# Patient Record
Sex: Male | Born: 1995 | Race: White | Hispanic: No | Marital: Single | Smoking: Former smoker
Health system: Southern US, Community
[De-identification: ages and names within clinical notes are randomized; demographics above are authoritative.]

## PROBLEM LIST (undated history)

## (undated) DIAGNOSIS — F1410 Cocaine abuse, uncomplicated: Secondary | ICD-10-CM

## (undated) DIAGNOSIS — F1490 Cocaine use, unspecified, uncomplicated: Secondary | ICD-10-CM

## (undated) DIAGNOSIS — K759 Inflammatory liver disease, unspecified: Secondary | ICD-10-CM

## (undated) DIAGNOSIS — J45909 Unspecified asthma, uncomplicated: Secondary | ICD-10-CM

---
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
## 2016-11-29 ENCOUNTER — Emergency Department
Admission: EM | Admit: 2016-11-29 | Discharge: 2016-11-29 | Disposition: A | Discharge status: Home / Self Care | Payer: Managed Care, Other (non HMO) | Attending: Emergency Medicine | Admitting: Emergency Medicine

## 2016-11-29 DIAGNOSIS — S025XXB Fracture of tooth (traumatic), initial encounter for open fracture: Secondary | ICD-10-CM | POA: Insufficient documentation

## 2016-11-29 DIAGNOSIS — F17210 Nicotine dependence, cigarettes, uncomplicated: Secondary | ICD-10-CM | POA: Insufficient documentation

## 2016-11-29 DIAGNOSIS — Y999 Unspecified external cause status: Secondary | ICD-10-CM | POA: Diagnosis not present

## 2016-11-29 DIAGNOSIS — X58XXXA Exposure to other specified factors, initial encounter: Secondary | ICD-10-CM | POA: Diagnosis not present

## 2016-11-29 DIAGNOSIS — Y929 Unspecified place or not applicable: Secondary | ICD-10-CM | POA: Insufficient documentation

## 2016-11-29 DIAGNOSIS — Y939 Activity, unspecified: Secondary | ICD-10-CM | POA: Diagnosis not present

## 2016-11-29 DIAGNOSIS — S0993XA Unspecified injury of face, initial encounter: Secondary | ICD-10-CM | POA: Diagnosis present

## 2016-11-29 MED ORDER — AMOXICILLIN 500 MG PO CAPS: 500.0000 mg | ORAL_CAPSULE | Freq: Three times a day (TID) | ORAL | 0 refills | Status: DC

## 2016-11-29 NOTE — Discharge Instructions (Signed)
OPTIONS FOR DENTAL FOLLOW UP CARE ° °[place] [facility] and [facility] - Local Safety Net Dental Clinics °http://www.ncdhhs.gov/dph/oralhealth/services/safetynetclinics.htm °  °[facility] (336-562-3123) ° °[facility] [place] ([phone]) ° °[facility] ([phone] ext [phone]) ° °[facility] ([phone]) ° °[facility] ([phone]) °This clinic caters to the indigent population and is on a lottery system. °Location: °[facility], [address], [place] °Clinic Hours: °Wednesdays from 6pm - 9pm, patients seen by a lottery system. °For dates, call or go to [email or web address] °Services: °Cleanings, fillings and simple extractions. °Payment Options: °DENTAL WORK IS FREE OF CHARGE. Bring proof of income or support. °Best way to get seen: °Arrive at 5:15 pm - this is a lottery, NOT first come/first serve, so arriving earlier will not increase your chances of being seen. °  °  °[facility] °[phone] °Select option 1 for emergencies °  °Location: °[facility], [address], [place] °Clinic Hours: °No walk-ins accepted - call the day before to schedule an appointment. °Check in times are 9:30 am and 1:30 pm. °Services: °Simple extractions, temporary fillings, pulpectomy/pulp debridement, uncomplicated abscess drainage. °Payment Options: °PAYMENT IS DUE AT THE TIME OF SERVICE.  Fee is usually $100-200, additional surgical procedures (e.g. abscess drainage) may be extra. °Cash, checks, Visa/MasterCard accepted.  Can file Medicaid if patient is covered for dental - patient should call case worker to check. °No discount for [facility] patients. °Best way to get seen: °MUST call the day before and get onto the schedule. Can usually be seen the next 1-2 days. No walk-ins accepted. °  °  °[facility] °[phone] °   °Location: °[facility], [address], [place] °Clinic Hours: °M, W, Th, F 8am or 1:30pm, Tues 9a or 1:30 - first come/first served. °Services: °Simple extractions, temporary fillings, uncomplicated abscess drainage.  You do not need to be an [place] resident. °Payment Options: °PAYMENT IS DUE AT THE TIME OF SERVICE. °Dental insurance, otherwise sliding scale - bring proof of income or support. °Depending on income and treatment needed, cost is usually $50-200. °Best way to get seen: °Arrive early as it is first come/first served. °  °  °[facility] °[phone] °  °Location: °[address] °Clinic Hours: °Mon-Thu 8a-5p °Services: °Most basic dental services including extractions and fillings. °Payment Options: °PAYMENT IS DUE AT THE TIME OF SERVICE. °Sliding scale, up to 50% off - bring proof if income or support. °Medicaid with dental option accepted. °Best way to get seen: °Call to schedule an appointment, can usually be seen within 2 weeks OR they will try to see walk-ins - show up at 8a or 2p (you may have to wait). °  °  °[facility] °[phone] °[place] RESIDENTS ONLY °  °Location: °[facility], [address], [place],  [address] °Clinic Hours: By appointment only. °Monday - Thursday 8am-5pm, Friday 8am-12pm °Services: Cleanings, fillings, extractions. °Payment Options: °PAYMENT IS DUE AT THE TIME OF SERVICE. °Cash, Visa or MasterCard. Sliding scale - $30 minimum per service. °Best way to get seen: °Come in to office, complete packet and make an appointment - need proof of income °or support monies for each household member and proof of [place] residence. °Usually takes about a month to get in. °  °  °[facility] °[phone] °  °Location: °[address]   [place] °Clinic Hours: Walk-in Urgent Care Dental Services are offered Monday-Friday  mornings only. °The numbers of emergencies accepted daily is limited to the number of °providers available. °Maximum 15 - Mondays, Wednesdays & Thursdays °Maximum 10 - Tuesdays & Fridays °Services: °You do not need to be a [place] resident to be seen for a dental emergency. °Emergencies are defined as pain, swelling, abnormal bleeding, or dental trauma. Walkins will receive x-rays if needed. °NOTE: Dental cleaning is not an emergency. °Payment Options: °PAYMENT IS DUE AT THE TIME OF SERVICE. °Minimum co-pay is $40.00 for uninsured patients. °Minimum co-pay is $3.00 for Medicaid with dental coverage. °Dental Insurance is accepted and must be presented at time of visit. °Medicare does not cover dental. °Forms of payment: Cash, credit card, checks. °Best way to get seen: °If not previously registered with the clinic, walk-in dental registration begins at 7:15 am and is on a first come/first serve basis. °If previously registered with the clinic, call to make an appointment. °  °  °[facility] °[phone] °[place] RESIDENTS ONLY °  °Location: °[address], [place], [place] °Clinic Hours: °Mon-Thu 10a-2p °Services: Extractions only! °Payment Options: °FREE (donations accepted) - bring proof of income or support °Best way to get seen: °Call and schedule an appointment OR come at 8am on the 1st Monday of every month (except for holidays) when it is first come/first served. °  °  °[facility] °[phone] °  °Location: °[address], [place] °Clinic Hours: °Friday mornings °Services, Payment Options, Best way to get seen: °Call for info °

## 2016-11-29 NOTE — ED Triage Notes (Signed)
States toothache L lower x 1 month, escalated today. Thinks he broke it yesterday.

## 2016-11-29 NOTE — ED Provider Notes (Signed)
[facility] Emergency Department Provider Note  ____________________________________________  Time seen: Approximately 1:06 PM  I have reviewed the triage vital signs and the nursing notes.   HISTORY  Chief Complaint Dental Pain    HPI Brian KerbsJason Zhan Jr. is a 21 y.o. male that presents to the emergency department with a broken tooth for 1 day. Patient states that he was pretty sure that the tooth was infected before was broken. He states that it's been painful and swollen for a couple of days. Yesterday he was eating popcorn and a large piece of his tooth broke off. He has braces on his teeth but no longer sees the orthodontist. He has been meaning to get them removed. He is not having any difficulty opening or closing his mouth. No drainage from mouth. He denies fever, shortness breath, chest pain, nausea, vomiting, abdominal pain.   No past medical history on file.  There are no active problems to display for this patient.   No past surgical history on file.  Prior to Admission medications   Medication Sig Start Date End Date Taking? Authorizing Provider  amoxicillin (AMOXIL) 500 MG capsule Take 1 capsule (500 mg total) by mouth 3 (three) times daily. 11/29/16   Enid DerryWagner, Grier Vu, PA-C  HYDROcodone-acetaminophen (NORCO/VICODIN) 5-325 MG tablet Take 1 tablet by mouth every 4 (four) hours as needed for moderate pain. 12/20/15   Cuthriell, Delorise RoyalsJonathan D, PA-C  lidocaine (XYLOCAINE) 2 % solution Apply small amount to cotton ball and apply to tooth prn pain 12/19/15   Tommi RumpsSummers, Rhonda L, PA-C    Allergies Patient has no known allergies.  No family history on file.  Social History Social History  Substance Use Topics  . Smoking status: Current Every Day Smoker    Packs/day: 0.50    Types: Cigarettes  . Smokeless tobacco: Not on file  . Alcohol use No     Review of Systems  Constitutional: No fever/chills Cardiovascular: No chest pain. Respiratory: No  SOB. Gastrointestinal: No abdominal pain.  No nausea, no vomiting.  Skin: Negative for rash, abrasions, lacerations, ecchymosis. Neurological: Negative for headaches   ____________________________________________   PHYSICAL EXAM:  VITAL SIGNS: ED Triage Vitals [11/29/16 1214]  Enc Vitals Group     BP 140/75     Pulse Rate (!) 57     Resp 18     Temp 98 F (36.7 C)     Temp Source Oral     SpO2 100 %     Weight 145 lb (65.8 kg)     Height 6' (1.829 m)     Head Circumference      Peak Flow      Pain Score 10     Pain Loc      Pain Edu?      Excl. in GC?      Constitutional: Alert and oriented. Well appearing and in no acute distress. Eyes: Conjunctivae are normal. PERRL. EOMI. Head: Atraumatic. ENT:      Ears:      Nose: No congestion/rhinnorhea.      Mouth/Throat: Mucous membranes are moist. Oropharynx nonerythematous. Uvula midline. Poor dentition overall. 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 Poor dentition overall. Braces on teeth with breaks in the medal. No difficulty opening and closing mouth. No TMJ pain. No drainage from mouth. No swelling. Neck: No stridor.  Cardiovascular: Normal rate, regular rhythm.  Good peripheral circulation. Respiratory: Normal respiratory effort without tachypnea or retractions. Lungs CTAB. Good air entry to the bases  with no decreased or absent breath sounds. Musculoskeletal: Full range of motion to all extremities. No gross deformities appreciated. Neurologic:  Normal speech and language. No gross focal neurologic deficits are appreciated.  Skin:  Skin is warm, dry and intact. No rash noted.   ____________________________________________   LABS (all labs ordered are listed, but only abnormal results are displayed)  Labs Reviewed - No data to display ____________________________________________  EKG   ____________________________________________  RADIOLOGY   No results  found.  ____________________________________________    PROCEDURES  Procedure(s) performed:    Procedures    Medications - No data to display   ____________________________________________   INITIAL IMPRESSION / ASSESSMENT AND PLAN / ED COURSE  Pertinent labs & imaging results that were available during my care of the patient were reviewed by me and considered in my medical decision making (see chart for details).  Review of the [place] CSRS was performed in accordance of the [facility] prior to dispensing any controlled drugs.   Patient's diagnosis is consistent with tooth fracture and infection. Vital signs and exam are reassuring. Education was provided and the patient is instructed to follow up with dentist. Patient will be discharged home with prescriptions for amoxicillin. Patient is given ED precautions to return to the ED for any worsening or new symptoms.     ____________________________________________  FINAL CLINICAL IMPRESSION(S) / ED DIAGNOSES  Final diagnoses:  Open fracture of tooth, initial encounter      NEW MEDICATIONS STARTED DURING THIS VISIT:  Discharge Medication List as of 11/29/2016  1:07 PM          This chart was dictated using voice recognition software/Dragon. Despite best efforts to proofread, errors can occur which can change the meaning. Any change was purely unintentional.    Enid Derry, PA-C 11/29/16 1358    Sharyn Creamer, MD 11/29/16 1610

## 2017-07-01 ENCOUNTER — Emergency Department: Payer: Managed Care, Other (non HMO)

## 2017-07-01 ENCOUNTER — Emergency Department
Admission: EM | Admit: 2017-07-01 | Discharge: 2017-07-01 | Disposition: A | Discharge status: Home / Self Care | Payer: Managed Care, Other (non HMO) | Attending: Emergency Medicine | Admitting: Emergency Medicine

## 2017-07-01 ENCOUNTER — Encounter: Payer: Self-pay | Admitting: Intensive Care

## 2017-07-01 DIAGNOSIS — R1011 Right upper quadrant pain: Secondary | ICD-10-CM | POA: Diagnosis present

## 2017-07-01 DIAGNOSIS — K2900 Acute gastritis without bleeding: Secondary | ICD-10-CM | POA: Insufficient documentation

## 2017-07-01 DIAGNOSIS — F17210 Nicotine dependence, cigarettes, uncomplicated: Secondary | ICD-10-CM | POA: Diagnosis not present

## 2017-07-01 LAB — URINALYSIS, COMPLETE (UACMP) WITH MICROSCOPIC
BACTERIA UA: NONE SEEN
Bilirubin Urine: NEGATIVE
GLUCOSE, UA: NEGATIVE mg/dL
Hgb urine dipstick: NEGATIVE
KETONES UR: NEGATIVE mg/dL
Leukocytes, UA: NEGATIVE
NITRITE: NEGATIVE
PROTEIN: 100 mg/dL — AB
Specific Gravity, Urine: 1.031 — ABNORMAL HIGH (ref 1.005–1.030)
Squamous Epithelial / LPF: NONE SEEN
pH: 5 (ref 5.0–8.0)

## 2017-07-01 LAB — COMPREHENSIVE METABOLIC PANEL
ALT: 16 U/L — ABNORMAL LOW (ref 17–63)
AST: 22 U/L (ref 15–41)
Albumin: 4.6 g/dL (ref 3.5–5.0)
Alkaline Phosphatase: 92 U/L (ref 38–126)
Anion gap: 8 (ref 5–15)
BILIRUBIN TOTAL: 0.8 mg/dL (ref 0.3–1.2)
BUN: 10 mg/dL (ref 6–20)
CO2: 29 mmol/L (ref 22–32)
Calcium: 9.3 mg/dL (ref 8.9–10.3)
Chloride: 99 mmol/L — ABNORMAL LOW (ref 101–111)
Creatinine, Ser: 1.29 mg/dL — ABNORMAL HIGH (ref 0.61–1.24)
GFR calc Af Amer: >60 mL/min (ref >60)
GFR calc non Af Amer: >60 mL/min (ref >60)
Glucose, Bld: 141 mg/dL — ABNORMAL HIGH (ref 65–99)
POTASSIUM: 3.7 mmol/L (ref 3.5–5.1)
Sodium: 136 mmol/L (ref 135–145)
TOTAL PROTEIN: 7.3 g/dL (ref 6.5–8.1)

## 2017-07-01 LAB — LIPASE, BLOOD: Lipase: 20 U/L (ref 11–51)

## 2017-07-01 LAB — CBC
HEMATOCRIT: 45.4 % (ref 40.0–52.0)
Hemoglobin: 15.5 g/dL (ref 13.0–18.0)
MCH: 29.7 pg (ref 26.0–34.0)
MCHC: 34.1 g/dL (ref 32.0–36.0)
MCV: 87.3 fL (ref 80.0–100.0)
Platelets: 266 10*3/uL (ref 150–440)
RBC: 5.21 MIL/uL (ref 4.40–5.90)
RDW: 13 % (ref 11.5–14.5)
WBC: 18.3 10*3/uL — AB (ref 3.8–10.6)

## 2017-07-01 MED ORDER — SODIUM CHLORIDE 0.9 % IV BOLUS (SEPSIS)
1000.0000 mL | INTRAVENOUS | Status: AC
  Administered 2017-07-01: 1000 mL via INTRAVENOUS

## 2017-07-01 MED ORDER — OMEPRAZOLE 20 MG PO CPDR: 20.0000 mg | DELAYED_RELEASE_CAPSULE | Freq: Two times a day (BID) | ORAL | 1 refills | Status: DC

## 2017-07-01 MED ORDER — IOPAMIDOL (ISOVUE-300) INJECTION 61%
30.0000 mL | Freq: Once | INTRAVENOUS | Status: AC | PRN
  Administered 2017-07-01: 15 mL via ORAL
  Filled 2017-07-01: qty 30

## 2017-07-01 MED ORDER — IOPAMIDOL (ISOVUE-300) INJECTION 61%
100.0000 mL | Freq: Once | INTRAVENOUS | Status: AC | PRN
  Administered 2017-07-01: 100 mL via INTRAVENOUS
  Filled 2017-07-01: qty 100

## 2017-07-01 MED ORDER — ONDANSETRON 4 MG PO TBDP: ORAL_TABLET | ORAL | 0 refills | Status: DC

## 2017-07-01 NOTE — ED Triage Notes (Signed)
Patient c/o epigastric pain X 1 week. Reports after eating and drinking getting very bloated and needing to throw up to feel better. Tender to touch on upper abdomen. Ambulatory with no problems

## 2017-07-01 NOTE — ED Provider Notes (Signed)
[facility] Emergency Department Provider Note  ____________________________________________   First MD Initiated Contact with Patient 07/01/17 2023     (approximate)  I have reviewed the triage vital signs and the nursing notes.   HISTORY  Chief Complaint Abdominal Pain    HPI Brian KerbsJason Folmar Jr. is a 21 y.o. male with no chronic medical history who presents for about 1 week of persistent and episodic epigastric and right upper quadrant pain.  He says it always occurs after he eats and is quite severe.  He had numerous episodes of vomiting last night in particular but has had intermittent vomiting throughout the week as well as multiple episodes of diarrhea.  Initially he thought may be it was food poisoning but it is lasted for a week rather than being only a couple of days.  He describes the pain as sharp, aching, and at times burning.  He denies fever/chills, chest pain, shortness of breath, and dysuria.  He describes the symptoms as severe and nothing makes it better including Pepto-Bismol and ibuprofen.  History reviewed. No pertinent past medical history.  There are no active problems to display for this patient.   History reviewed. No pertinent surgical history.  Prior to Admission medications   Medication Sig Start Date End Date Taking? Authorizing Provider  amoxicillin (AMOXIL) 500 MG capsule Take 1 capsule (500 mg total) by mouth 3 (three) times daily. 11/29/16   Enid DerryWagner, Ashley, PA-C  HYDROcodone-acetaminophen (NORCO/VICODIN) 5-325 MG tablet Take 1 tablet by mouth every 4 (four) hours as needed for moderate pain. 12/20/15   Cuthriell, Delorise RoyalsJonathan D, PA-C  lidocaine (XYLOCAINE) 2 % solution Apply small amount to cotton ball and apply to tooth prn pain 12/19/15   Tommi RumpsSummers, Rhonda L, PA-C  omeprazole (PRILOSEC) 20 MG capsule Take 1 capsule (20 mg total) by mouth 2 (two) times daily. 07/01/17 07/01/18  Loleta RoseForbach, Reesha Debes, MD  ondansetron (ZOFRAN ODT) 4 MG  disintegrating tablet Allow 1-2 tablets to dissolve in your mouth every 8 hours as needed for nausea/vomiting 07/01/17   Loleta RoseForbach, [place] Droll, MD    Allergies Patient has no known allergies.  History reviewed. No pertinent family history.  Social History Social History   Tobacco Use  . Smoking status: Current Every Day Smoker    Packs/day: 0.50    Types: Cigarettes  . Smokeless tobacco: Never Used  Substance Use Topics  . Alcohol use: Yes    Comment: occ  . Drug use: Yes    Types: Marijuana    Review of Systems Constitutional: No fever/chills Eyes: No visual changes. ENT: No sore throat. Cardiovascular: Denies chest pain. Respiratory: Denies shortness of breath. Gastrointestinal: As above Genitourinary: Negative for dysuria. Musculoskeletal: Negative for neck pain.  Negative for back pain. Integumentary: Negative for rash. Neurological: Negative for headaches, focal weakness or numbness.   ____________________________________________   PHYSICAL EXAM:  VITAL SIGNS: ED Triage Vitals  Enc Vitals Group     BP 07/01/17 1858 (!) 148/73     Pulse Rate 07/01/17 1858 89     Resp 07/01/17 1858 16     Temp 07/01/17 1858 97.6 F (36.4 C)     Temp Source 07/01/17 1858 Oral     SpO2 07/01/17 1858 99 %     Weight 07/01/17 1856 65.8 kg (145 lb)     Height 07/01/17 1856 1.829 m (6')     Head Circumference --      Peak Flow --      Pain Score 07/01/17 1856  10     Pain Loc --      Pain Edu? --      Excl. in GC? --     Constitutional: Alert and oriented.  Disheveled but generally well-appearing Eyes: Conjunctivae are normal.  Head: Atraumatic. Cardiovascular: Normal rate, regular rhythm. Good peripheral circulation. Grossly normal heart sounds. Respiratory: Normal respiratory effort.  No retractions. Lungs CTAB. Gastrointestinal: Soft with no lower abdominal tenderness but significant epigastric tenderness and right upper quadrant tenderness with positive Murphy  sign. Musculoskeletal: No lower extremity tenderness nor edema. No gross deformities of extremities. Neurologic:  Normal speech and language. No gross focal neurologic deficits are appreciated.  Skin:  Skin is warm, dry and intact. No rash noted. Psychiatric: Mood and affect are normal. Speech and behavior are normal.  ____________________________________________   LABS (all labs ordered are listed, but only abnormal results are displayed)  Labs Reviewed  COMPREHENSIVE METABOLIC PANEL - Abnormal; Notable for the following components:      Result Value   Chloride 99 (*)    Glucose, Bld 141 (*)    Creatinine, Ser 1.29 (*)    ALT 16 (*)    All other components within normal limits  CBC - Abnormal; Notable for the following components:   WBC 18.3 (*)    All other components within normal limits  URINALYSIS, COMPLETE (UACMP) WITH MICROSCOPIC - Abnormal; Notable for the following components:   Color, Urine AMBER (*)    APPearance CLEAR (*)    Specific Gravity, Urine 1.031 (*)    Protein, ur 100 (*)    All other components within normal limits  LIPASE, BLOOD   ____________________________________________  EKG  None - EKG not ordered by ED physician ____________________________________________  RADIOLOGY   Ct Abdomen Pelvis W Contrast  Result Date: 07/01/2017 CLINICAL DATA:  Upper abdominal pain for 6 days. Diarrhea and vomiting. EXAM: CT ABDOMEN AND PELVIS WITH CONTRAST TECHNIQUE: Multidetector CT imaging of the abdomen and pelvis was performed using the standard protocol following bolus administration of intravenous contrast. CONTRAST:  100mL ISOVUE-300 IOPAMIDOL (ISOVUE-300) INJECTION 61% COMPARISON:  None. FINDINGS: Lower chest: No acute abnormality. Hepatobiliary: No focal liver abnormality is seen. No gallstones, gallbladder wall thickening, or biliary dilatation. Pancreas: Unremarkable. No pancreatic ductal dilatation or surrounding inflammatory changes. Spleen: Normal in  size without focal abnormality. Adrenals/Urinary Tract: Adrenal glands are unremarkable. Kidneys are normal, without renal calculi, focal lesion, or hydronephrosis. Bladder is unremarkable. Stomach/Bowel: Stomach is within normal limits. Appendix is normal. No evidence of bowel wall thickening, distention, or inflammatory changes. Enteric contrast has reached the right hemicolon. Vascular/Lymphatic: No significant vascular findings are present. No enlarged abdominal or pelvic lymph nodes. Reproductive: Unremarkable Other: No focal inflammation.  No ascites. Musculoskeletal: No significant skeletal lesion. IMPRESSION: No significant abnormality. Electronically Signed   By: Ellery Plunkaniel R Mitchell M.D.   On: 07/01/2017 22:48   [place] Abdomen Limited Ruq  Result Date: 07/01/2017 CLINICAL DATA:  Severe epigastric and right upper quadrant pain for 1 week. EXAM: ULTRASOUND ABDOMEN LIMITED RIGHT UPPER QUADRANT COMPARISON:  None. FINDINGS: Gallbladder: The gallbladder is normally dilated. There is gallbladder sludge. Wall thickness measures 2.8 mm, upper limits of normal. Sonographic Murphy's sign was reported as negative. Common bile duct: Diameter: 2.2 mm Liver: No focal lesion identified. Within normal limits in parenchymal echogenicity. Portal vein is patent on color Doppler imaging with normal direction of blood flow towards the liver. IMPRESSION: Gallbladder sludge without evidence of acute cholecystitis. Electronically Signed   By: Ted Mcalpineobrinka  Dimitrova  M.D.   On: 07/01/2017 21:29    ____________________________________________   PROCEDURES  Critical Care performed: No   Procedure(s) performed:   Procedures   ____________________________________________   INITIAL IMPRESSION / ASSESSMENT AND PLAN / ED COURSE  As part of my medical decision making, I reviewed the following data within the electronic MEDICAL RECORD NUMBER Nursing notes reviewed and incorporated and Labs reviewed     Differential diagnosis  includes, but is not limited to, biliary disease (biliary colic, acute cholecystitis, cholangitis, choledocholithiasis, etc), intrathoracic causes for epigastric abdominal pain including ACS, gastritis, duodenitis, pancreatitis, small bowel or large bowel obstruction, abdominal aortic aneurysm, hernia, and gastritis.  Biliary colic would be unlikely at his age but the history of present illness and physical exam are most consistent with this process.  I feel that gastritis or gastroenteritis is a likely secondary diagnosis.  The patient denies any recent alcohol use and drug use and denies any blood in his stool or vomit ulcerative disease is also possible.  If the ultrasound is normal I will consider whether he needs CT scan, but given the severity of his tenderness and his leukocytosis it may be worth a scan to make sure there is not any obvious gastric abnormality such as an ulcer.  Clinical Course as of Jul 02 2255  Wed Jul 01, 2017  2136 Gallbladder sludge with thickened wall at the upper limit of normal but no evidence of gallstones and no solid evidence of cholecystitis. US ABDOMEN LIMITED RUQ [CF]  2254 No evidence of acute abnormality on CT scan.  Patient is comfortable with the plan to go home and follow-up as an outpatient.  I am treating him empirically for peptic ulcer disease with my recommendation of 2 months of twice daily PPI, bland diet, avoidance of NSAIDs, etc.  I did, however, encouraged him to follow-up with GI given his young age, severity of symptoms, and need for possible additional testing.  He understands and states he will do so. CT Abdomen Pelvis W Contrast [CF]  2255 Gave my usual and customary return precautions.  [CF]    Clinical Course User Index [CF] Loleta Rose, MD    ____________________________________________  FINAL CLINICAL IMPRESSION(S) / ED DIAGNOSES  Final diagnoses:  Acute gastritis without hemorrhage, unspecified gastritis type     MEDICATIONS GIVEN  DURING THIS VISIT:  Medications  sodium chloride 0.9 % bolus 1,000 mL (1,000 mLs Intravenous New Bag/Given 07/01/17 2204)  iopamidol (ISOVUE-300) 61 % injection 30 mL (15 mLs Oral Contrast Given 07/01/17 2206)  iopamidol (ISOVUE-300) 61 % injection 100 mL (100 mLs Intravenous Contrast Given 07/01/17 2226)     ED Discharge Orders        Ordered    omeprazole (PRILOSEC) 20 MG capsule  2 times daily     07/01/17 2247    ondansetron (ZOFRAN ODT) 4 MG disintegrating tablet     07/01/17 2247       Note:  This document was prepared using Dragon voice recognition software and may include unintentional dictation errors.    Loleta Rose, MD 07/01/17 2256

## 2017-07-01 NOTE — Discharge Instructions (Signed)
As we discussed, we believe your symptoms are the result of gastritis, or possibly a small ulcer that is not visible on CT scan.  We encourage you to take omeprazole as prescribed for at least a full two months (longer if you continue having symptoms).  Please avoid taking ibuprofen, naproxen, aspirin, Goody or BC powders, or similar medications (called NSAIDs).  You may take over-the-counter Tylenol as needed for pain.  Please read through the included information and try to avoid spicy and greasy foods in addition to the medications listed above.  We also strongly encourage you to call the office of Dr. Tobi BastosAnna to schedule a follow up appointment with gastroenterology.  They may feel you would benefit from additional testing or treatment and can also schedule you for an endoscopy if they feel it would be beneficial.  Return to the emergency department if you develop new or worsening symptoms that concern you.

## 2017-07-01 NOTE — ED Notes (Signed)
Pt reports upper abd pain for 6 days.  Pt has watery diarrhea and vomiting.   Pt taking otc meds without relief.  Pt states pain is worse after eating meals.  Pt alert.

## 2017-07-09 ENCOUNTER — Ambulatory Visit: Payer: Managed Care, Other (non HMO) | Admitting: Gastroenterology

## 2017-07-30 ENCOUNTER — Ambulatory Visit: Payer: Managed Care, Other (non HMO) | Admitting: Gastroenterology

## 2017-07-30 ENCOUNTER — Encounter: Payer: Self-pay | Admitting: Gastroenterology

## 2017-11-02 ENCOUNTER — Encounter: Payer: Self-pay | Admitting: Emergency Medicine

## 2017-11-02 ENCOUNTER — Other Ambulatory Visit: Payer: Self-pay

## 2017-11-02 ENCOUNTER — Emergency Department
Admission: EM | Admit: 2017-11-02 | Discharge: 2017-11-02 | Disposition: A | Discharge status: Home / Self Care | Payer: Managed Care, Other (non HMO) | Attending: Emergency Medicine | Admitting: Emergency Medicine

## 2017-11-02 DIAGNOSIS — F1110 Opioid abuse, uncomplicated: Secondary | ICD-10-CM | POA: Insufficient documentation

## 2017-11-02 DIAGNOSIS — Z5321 Procedure and treatment not carried out due to patient leaving prior to being seen by health care provider: Secondary | ICD-10-CM | POA: Insufficient documentation

## 2017-11-02 NOTE — ED Triage Notes (Signed)
Patient states "I am just trying to get clean, detox, from heroin."  Last used at 0900.  Denies SI/ HI.  AAOx3.  Calm and cooperative.

## 2017-11-06 ENCOUNTER — Encounter: Payer: Self-pay | Admitting: Emergency Medicine

## 2017-11-06 ENCOUNTER — Emergency Department
Admission: EM | Admit: 2017-11-06 | Discharge: 2017-11-06 | Disposition: A | Discharge status: Home / Self Care | Payer: Self-pay | Attending: Emergency Medicine | Admitting: Emergency Medicine

## 2017-11-06 ENCOUNTER — Other Ambulatory Visit: Payer: Self-pay

## 2017-11-06 DIAGNOSIS — F1193 Opioid use, unspecified with withdrawal: Secondary | ICD-10-CM

## 2017-11-06 DIAGNOSIS — Z79899 Other long term (current) drug therapy: Secondary | ICD-10-CM | POA: Insufficient documentation

## 2017-11-06 DIAGNOSIS — F17210 Nicotine dependence, cigarettes, uncomplicated: Secondary | ICD-10-CM | POA: Insufficient documentation

## 2017-11-06 DIAGNOSIS — F1123 Opioid dependence with withdrawal: Secondary | ICD-10-CM | POA: Insufficient documentation

## 2017-11-06 DIAGNOSIS — F1110 Opioid abuse, uncomplicated: Secondary | ICD-10-CM

## 2017-11-06 MED ORDER — CLONIDINE HCL 0.1 MG PO TABS: 0.1000 mg | ORAL_TABLET | Freq: Three times a day (TID) | ORAL | 0 refills | Status: DC | PRN

## 2017-11-06 MED ORDER — IBUPROFEN 600 MG PO TABS
ORAL_TABLET | ORAL | Status: AC
  Administered 2017-11-06: 600 mg via ORAL
  Filled 2017-11-06: qty 1

## 2017-11-06 MED ORDER — ONDANSETRON 4 MG PO TBDP
4.0000 mg | ORAL_TABLET | Freq: Once | ORAL | Status: AC
  Administered 2017-11-06: 4 mg via ORAL

## 2017-11-06 MED ORDER — CYCLOBENZAPRINE HCL 10 MG PO TABS
ORAL_TABLET | ORAL | Status: AC
  Administered 2017-11-06: 10 mg via ORAL
  Filled 2017-11-06: qty 1

## 2017-11-06 MED ORDER — CLONIDINE HCL 0.1 MG PO TABS
0.1000 mg | ORAL_TABLET | Freq: Once | ORAL | Status: AC
  Administered 2017-11-06: 0.1 mg via ORAL

## 2017-11-06 MED ORDER — IBUPROFEN 400 MG PO TABS
600.0000 mg | ORAL_TABLET | Freq: Once | ORAL | Status: AC
  Administered 2017-11-06: 600 mg via ORAL

## 2017-11-06 MED ORDER — CYCLOBENZAPRINE HCL 10 MG PO TABS: 10.0000 mg | ORAL_TABLET | Freq: Three times a day (TID) | ORAL | 0 refills | Status: DC | PRN

## 2017-11-06 MED ORDER — CYCLOBENZAPRINE HCL 10 MG PO TABS
10.0000 mg | ORAL_TABLET | Freq: Once | ORAL | Status: AC
  Administered 2017-11-06: 10 mg via ORAL

## 2017-11-06 MED ORDER — CLONIDINE HCL 0.1 MG PO TABS
ORAL_TABLET | ORAL | Status: AC
  Administered 2017-11-06: 0.1 mg via ORAL
  Filled 2017-11-06: qty 1

## 2017-11-06 MED ORDER — ONDANSETRON 4 MG PO TBDP
ORAL_TABLET | ORAL | Status: AC
  Administered 2017-11-06: 4 mg via ORAL
  Filled 2017-11-06: qty 1

## 2017-11-06 MED ORDER — ONDANSETRON HCL 4 MG PO TABS: 4.0000 mg | ORAL_TABLET | Freq: Every day | ORAL | 0 refills | Status: AC | PRN

## 2017-11-06 MED ORDER — IBUPROFEN 600 MG PO TABS: 600.0000 mg | ORAL_TABLET | Freq: Three times a day (TID) | ORAL | 0 refills | Status: DC | PRN

## 2017-11-06 NOTE — ED Provider Notes (Signed)
[facility] Emergency Department Provider Note  ____________________________________________   First MD Initiated Contact with Patient 11/06/17 1354     (approximate)  I have reviewed the triage vital signs and the nursing notes.   HISTORY  Chief Complaint detox   HPI Brian Decker. is a 22 y.o. male who comes to the emergency department today with his parents requesting help with heroin withdrawal.  The patient is an IV heroin user and last used yesterday evening.  He uses intravenously daily for the past 6 months or so.  He has a bed waiting for him at [facility] for inpatient rehabilitation today however he has noted increasing nausea, food intolerance, myalgias, chills, and fatigue and is requesting help with his withdrawal symptoms.  He has Narcan at home.  His symptoms have been gradual onset are now moderate to severe.  Nothing seems to make them better they are clearly worse when not using heroin.  History reviewed. No pertinent past medical history.  There are no active problems to display for this patient.   History reviewed. No pertinent surgical history.  Prior to Admission medications   Medication Sig Start Date End Date Taking? Authorizing Provider  amoxicillin (AMOXIL) 500 MG capsule Take 1 capsule (500 mg total) by mouth 3 (three) times daily. 11/29/16   Enid Derry, PA-C  cloNIDine (CATAPRES) 0.1 MG tablet Take 1 tablet (0.1 mg total) by mouth 3 (three) times daily as needed (withdrawal symptoms). 11/06/17 11/06/18  Merrily Brittle, MD  cyclobenzaprine (FLEXERIL) 10 MG tablet Take 1 tablet (10 mg total) by mouth 3 (three) times daily as needed for muscle spasms. 11/06/17   Merrily Brittle, MD  HYDROcodone-acetaminophen (NORCO/VICODIN) 5-325 MG tablet Take 1 tablet by mouth every 4 (four) hours as needed for moderate pain. 12/20/15   Cuthriell, Delorise Royals, PA-C  ibuprofen (ADVIL,MOTRIN) 600 MG tablet Take 1 tablet (600 mg total) by mouth  every 8 (eight) hours as needed. 11/06/17   Merrily Brittle, MD  lidocaine (XYLOCAINE) 2 % solution Apply small amount to cotton ball and apply to tooth prn pain 12/19/15   Tommi Rumps, PA-C  omeprazole (PRILOSEC) 20 MG capsule Take 1 capsule (20 mg total) by mouth 2 (two) times daily. 07/01/17 07/01/18  Loleta Rose, MD  ondansetron (ZOFRAN ODT) 4 MG disintegrating tablet Allow 1-2 tablets to dissolve in your mouth every 8 hours as needed for nausea/vomiting 07/01/17   Loleta Rose, MD  ondansetron (ZOFRAN) 4 MG tablet Take 1 tablet (4 mg total) by mouth daily as needed. 11/06/17 11/06/18  Merrily Brittle, MD    Allergies Patient has no known allergies.  No family history on file.  Social History Social History   Tobacco Use  . Smoking status: Current Every Day Smoker    Packs/day: 0.50    Types: Cigarettes  . Smokeless tobacco: Never Used  Substance Use Topics  . Alcohol use: Yes    Comment: occ  . Drug use: Yes    Types: Marijuana, IV    Comment: heroin -- last used 11/02/17 @ 0900    Review of Systems Constitutional: Positive for chills Eyes: No visual changes. ENT: No sore throat. Cardiovascular: Denies chest pain. Respiratory: Denies shortness of breath. Gastrointestinal: Positive for abdominal pain.  Positive for nausea, no vomiting.  No diarrhea.  No constipation. Genitourinary: Negative for dysuria. Musculoskeletal: Negative for back pain. Skin: Negative for rash. Neurological: Positive for headache   ____________________________________________   PHYSICAL EXAM:  VITAL SIGNS: ED Triage  Vitals  Enc Vitals Group     BP 11/06/17 1254 123/72     Pulse Rate 11/06/17 1254 69     Resp 11/06/17 1254 18     Temp 11/06/17 1254 98.5 F (36.9 C)     Temp Source 11/06/17 1254 Oral     SpO2 11/06/17 1254 99 %     Weight 11/06/17 1255 145 lb (65.8 kg)     Height 11/06/17 1255 6' (1.829 m)     Head Circumference --      Peak Flow --      Pain Score 11/06/17  1258 0     Pain Loc --      Pain Edu? --      Excl. in GC? --     Constitutional: Alert and oriented x4 appears uncomfortable bundled up in blankets shivering Eyes: PERRL EOMI. pupils are 6 mm and brisk Head: Atraumatic. Nose: No congestion/rhinnorhea. Mouth/Throat: No trismus Neck: No stridor.   Cardiovascular: Normal rate, regular rhythm. Grossly normal heart sounds.  Good peripheral circulation. Respiratory: Normal respiratory effort.  No retractions. Lungs CTAB and moving good air Gastrointestinal: Soft abdomen mild diffuse tenderness with no focality no rebound or guarding or peritonitis Musculoskeletal: No lower extremity edema   Neurologic:  Normal speech and language. No gross focal neurologic deficits are appreciated. Skin: Positive for piloerection Psychiatric: Mood and affect are normal. Speech and behavior are normal.    ____________________________________________   DIFFERENTIAL includes but not limited to  Heroin addiction, heroin withdrawal, drug overdose ____________________________________________   LABS (all labs ordered are listed, but only abnormal results are displayed)  Labs Reviewed - No data to display   __________________________________________  EKG   ____________________________________________  RADIOLOGY   ____________________________________________   PROCEDURES  Procedure(s) performed: no  Procedures  Critical Care performed: no  Observation: no ____________________________________________   INITIAL IMPRESSION / ASSESSMENT AND PLAN / ED COURSE  Pertinent labs & imaging results that were available during my care of the patient were reviewed by me and considered in my medical decision making (see chart for details).  The patient is clearly withdrawing from opioids.  Feels improved after ibuprofen Flexeril and clonidine.  I also did give him naloxone for home however he said he already has some.  He is with his parents now  and has a place for inpatient rehabilitation tonight.  I discussed the importance of maintaining his sobriety and the danger of overdose if he should relapse in the future once he has fully detoxed.  I will also prescribe him a short course of clonidine to help him get through the acute symptoms.  He is discharged home in good condition and verbalizes understanding agreement plan.      ____________________________________________   FINAL CLINICAL IMPRESSION(S) / ED DIAGNOSES  Final diagnoses:  Heroin withdrawal (HCC)  Heroin abuse (HCC)      NEW MEDICATIONS STARTED DURING THIS VISIT:  New Prescriptions   CLONIDINE (CATAPRES) 0.1 MG TABLET    Take 1 tablet (0.1 mg total) by mouth 3 (three) times daily as needed (withdrawal symptoms).   CYCLOBENZAPRINE (FLEXERIL) 10 MG TABLET    Take 1 tablet (10 mg total) by mouth 3 (three) times daily as needed for muscle spasms.   IBUPROFEN (ADVIL,MOTRIN) 600 MG TABLET    Take 1 tablet (600 mg total) by mouth every 8 (eight) hours as needed.   ONDANSETRON (ZOFRAN) 4 MG TABLET    Take 1 tablet (4 mg total) by mouth daily as  needed.     Note:  This document was prepared using Dragon voice recognition software and may include unintentional dictation errors.     Merrily Brittleifenbark, Tajah Noguchi, MD 11/06/17 1452

## 2017-11-06 NOTE — Discharge Instructions (Signed)
Congratulations on your continuing sobriety.  In addition to Narcotics Anonymous please consider joining [facility] [email or web address] which has a different philosophy and can be quite effective.  Please return to the ED for any concerns.  It was a pleasure to take care of you today, and thank you for coming to our emergency department.  If you have any questions or concerns before leaving please ask the nurse to grab me and I'm more than happy to go through your aftercare instructions again.  If you were prescribed any opioid pain medication today such as Norco, Vicodin, Percocet, morphine, hydrocodone, or oxycodone please make sure you do not drive when you are taking this medication as it can alter your ability to drive safely.  If you have any concerns once you are home that you are not improving or are in fact getting worse before you can make it to your follow-up appointment, please do not hesitate to call 911 and come back for further evaluation.  Merrily BrittleNeil Eustace Hur, MD

## 2017-11-06 NOTE — ED Triage Notes (Addendum)
Arrives with c/o withdrawal from heroin.  States last used yesterday.  Arrives with mother, who states they have been trying to get into a treatment center and has been in touch with [facility], who states there will be some discharges tomorrow, so the plan is to go to [facility] tomorrow.  Patient hear for something to help him until that time.  Patient Awake and alert, calm and cooperative.  C/O generalized body aches and anxiety.  Denies SI/ HI.

## 2017-11-06 NOTE — ED Notes (Signed)
ED Provider at bedside. 

## 2019-07-27 ENCOUNTER — Encounter: Payer: Self-pay | Admitting: Emergency Medicine

## 2019-07-27 ENCOUNTER — Other Ambulatory Visit: Payer: Self-pay

## 2019-07-27 ENCOUNTER — Emergency Department
Admission: EM | Admit: 2019-07-27 | Discharge: 2019-07-27 | Disposition: A | Discharge status: Home / Self Care | Payer: Self-pay | Attending: Emergency Medicine | Admitting: Emergency Medicine

## 2019-07-27 DIAGNOSIS — F1110 Opioid abuse, uncomplicated: Secondary | ICD-10-CM | POA: Insufficient documentation

## 2019-07-27 DIAGNOSIS — Z5321 Procedure and treatment not carried out due to patient leaving prior to being seen by health care provider: Secondary | ICD-10-CM | POA: Insufficient documentation

## 2019-07-27 NOTE — ED Notes (Signed)
Called for patient multiple times to be roomed- unable to find patient.

## 2019-07-27 NOTE — ED Triage Notes (Signed)
Patient ambulatory to triage with steady gait, without difficulty or distress noted; pt reports using heroin tonight and wants help with detox

## 2019-08-06 ENCOUNTER — Emergency Department
Admission: EM | Admit: 2019-08-06 | Discharge: 2019-08-06 | Disposition: A | Discharge status: Home / Self Care | Payer: Self-pay | Attending: Emergency Medicine | Admitting: Emergency Medicine

## 2019-08-06 ENCOUNTER — Other Ambulatory Visit: Payer: Self-pay

## 2019-08-06 DIAGNOSIS — F1490 Cocaine use, unspecified, uncomplicated: Secondary | ICD-10-CM | POA: Insufficient documentation

## 2019-08-06 DIAGNOSIS — F1290 Cannabis use, unspecified, uncomplicated: Secondary | ICD-10-CM | POA: Insufficient documentation

## 2019-08-06 DIAGNOSIS — F14951 Cocaine use, unspecified with cocaine-induced psychotic disorder with hallucinations: Secondary | ICD-10-CM | POA: Insufficient documentation

## 2019-08-06 DIAGNOSIS — Z8659 Personal history of other mental and behavioral disorders: Secondary | ICD-10-CM | POA: Insufficient documentation

## 2019-08-06 DIAGNOSIS — F1190 Opioid use, unspecified, uncomplicated: Secondary | ICD-10-CM | POA: Insufficient documentation

## 2019-08-06 DIAGNOSIS — F1910 Other psychoactive substance abuse, uncomplicated: Secondary | ICD-10-CM | POA: Insufficient documentation

## 2019-08-06 DIAGNOSIS — F17210 Nicotine dependence, cigarettes, uncomplicated: Secondary | ICD-10-CM | POA: Insufficient documentation

## 2019-08-06 DIAGNOSIS — F19950 Other psychoactive substance use, unspecified with psychoactive substance-induced psychotic disorder with delusions: Secondary | ICD-10-CM | POA: Diagnosis present

## 2019-08-06 LAB — CBC
HCT: 43 % (ref 39.0–52.0)
Hemoglobin: 14.3 g/dL (ref 13.0–17.0)
MCH: 29.1 pg (ref 26.0–34.0)
MCHC: 33.3 g/dL (ref 30.0–36.0)
MCV: 87.6 fL (ref 80.0–100.0)
Platelets: 426 10*3/uL — ABNORMAL HIGH (ref 150–400)
RBC: 4.91 MIL/uL (ref 4.22–5.81)
RDW: 12.8 % (ref 11.5–15.5)
WBC: 17.4 10*3/uL — ABNORMAL HIGH (ref 4.0–10.5)
nRBC: 0 % (ref 0.0–0.2)

## 2019-08-06 LAB — COMPREHENSIVE METABOLIC PANEL
ALT: 247 U/L — ABNORMAL HIGH (ref 0–44)
AST: 125 U/L — ABNORMAL HIGH (ref 15–41)
Albumin: 4.9 g/dL (ref 3.5–5.0)
Alkaline Phosphatase: 113 U/L (ref 38–126)
Anion gap: 12 (ref 5–15)
BUN: 14 mg/dL (ref 6–20)
CO2: 25 mmol/L (ref 22–32)
Calcium: 9.6 mg/dL (ref 8.9–10.3)
Chloride: 101 mmol/L (ref 98–111)
Creatinine, Ser: 1.36 mg/dL — ABNORMAL HIGH (ref 0.61–1.24)
GFR calc Af Amer: >60 mL/min (ref >60)
GFR calc non Af Amer: >60 mL/min (ref >60)
Glucose, Bld: 110 mg/dL — ABNORMAL HIGH (ref 70–99)
Potassium: 3.9 mmol/L (ref 3.5–5.1)
Sodium: 138 mmol/L (ref 135–145)
Total Bilirubin: 1 mg/dL (ref 0.3–1.2)
Total Protein: 8.6 g/dL — ABNORMAL HIGH (ref 6.5–8.1)

## 2019-08-06 LAB — URINE DRUG SCREEN, QUALITATIVE (ARMC ONLY)
Amphetamines, Ur Screen: NOT DETECTED
Barbiturates, Ur Screen: NOT DETECTED
Benzodiazepine, Ur Scrn: NOT DETECTED
Cannabinoid 50 Ng, Ur ~~LOC~~: POSITIVE — AB
Cocaine Metabolite,Ur ~~LOC~~: POSITIVE — AB
MDMA (Ecstasy)Ur Screen: NOT DETECTED
Methadone Scn, Ur: NOT DETECTED
Opiate, Ur Screen: POSITIVE — AB
Phencyclidine (PCP) Ur S: NOT DETECTED
Tricyclic, Ur Screen: NOT DETECTED

## 2019-08-06 LAB — ACETAMINOPHEN LEVEL: Acetaminophen (Tylenol), Serum: <10 ug/mL — ABNORMAL LOW (ref 10–30)

## 2019-08-06 LAB — SALICYLATE LEVEL: Salicylate Lvl: <7 mg/dL — ABNORMAL LOW (ref 7.0–30.0)

## 2019-08-06 LAB — ETHANOL: Alcohol, Ethyl (B): <10 mg/dL (ref <10)

## 2019-08-06 NOTE — ED Notes (Signed)
Patient received breakfast tray and beverage, no signs of distress.

## 2019-08-06 NOTE — BH Assessment (Signed)
Writer spoke with patient to complete updated/reassessment. Patient denies SI/HI and AV/H. 

## 2019-08-06 NOTE — ED Notes (Signed)
Nurse Tech applied bacitracin to right hand abrasion due to injury prior to His coming to ED, Patient is pleasant and cooperative, Patient will be discharged home.

## 2019-08-06 NOTE — Discharge Instructions (Signed)
[facility] [address] [place], [address] [place], [place] [address] [phone]

## 2019-08-06 NOTE — Consult Note (Addendum)
[facility] Face-to-Face Psychiatry Consult   Reason for Consult:  Psych evaluation Referring Physician:  Dr. Dolores Frame Patient Identification: Brian Decker. MRN:  [id number] Principal Diagnosis: <principal problem not specified> Diagnosis:  Active Problems:   * No active hospital problems. *   Total Time spent with patient: 45 minutes  Subjective:   Brian Decker. is a 24 y.o. male patient admitted with (per EDP)Brian Decker. is a 24 y.o. male brought to the ED under IVC with police for substance use, delusions and stabbing walls with a knife.  Patient currently denies SI/HI/AH/VH.  Voices no medical complaints. Brian Decker Kitchen  HPI:  Brian Decker., 24 y.o., male patient seen face to face by this provider; chart reviewed and consulted with Dr. Dolores Frame on 08/06/19.  On evaluation Brian Khim. reports  That he had been "clean" for six months of heroin leading up to today.  He say he's embarrassed about how he acted and knows it was a result of the drug use because he does not want to harm himself or anyone else.  Patient has a hx os a suicide attempt at age 72.  He is very concerned about leaving because he states he starts a new job today at [employer].   During evaluation [employer] is sitting up on the bed; he is alert/oriented x 4; calm/cooperative;patient is embarrassed by his behavior and states that as soon as he leaves he going to continue his NA meetings and go back to being clean. His mood is pleasant and calm and mood and its  congruent with affect.  Patient is speaking in a clear tone at moderate volume, and normal pace; with good eye contact.  His thought process is coherent and relevant; There is no indication that he is currently responding to internal/external stimuli or experiencing delusional thought content.  Patient denies suicidal/self-harm/homicidal ideation, psychosis, and paranoia.  Patient has remained calm throughout assessment and has answered questions appropriately and blames his  behavior on his drug intoxication. Pt is positive for marijuana, cocaine and heroin.   Past Psychiatric History: patient has a hx of depression and a suicide attempt  Risk to Self:  no Risk to Others:  no Prior Inpatient Therapy:  yes Prior Outpatient Therapy:  yes  Past Medical History: No past medical history on file. No past surgical history on file. Family History: No family history on file. Family Psychiatric  History: unknown Social History:  Social History   Substance and Sexual Activity  Alcohol Use Yes   Comment: occ     Social History   Substance and Sexual Activity  Drug Use Yes  . Types: Marijuana, IV   Comment: heroin    Social History   Socioeconomic History  . Marital status: Single    Spouse name: Not on file  . Number of children: Not on file  . Years of education: Not on file  . Highest education level: Not on file  Occupational History  . Not on file  Tobacco Use  . Smoking status: Current Every Day Smoker    Packs/day: 0.50    Types: Cigarettes  . Smokeless tobacco: Never Used  Substance and Sexual Activity  . Alcohol use: Yes    Comment: occ  . Drug use: Yes    Types: Marijuana, IV    Comment: heroin  . Sexual activity: Not on file  Other Topics Concern  . Not on file  Social History Narrative  . Not on file   Social  Determinants of Health   Financial Resource Strain:   . Difficulty of Paying Living Expenses: Not on file  Food Insecurity:   . Worried About Charity fundraiser in the Last Year: Not on file  . Ran Out of Food in the Last Year: Not on file  Transportation Needs:   . Lack of Transportation (Medical): Not on file  . Lack of Transportation (Non-Medical): Not on file  Physical Activity:   . Days of Exercise per Week: Not on file  . Minutes of Exercise per Session: Not on file  Stress:   . Feeling of Stress : Not on file  Social Connections:   . Frequency of Communication with Friends and Family: Not on file  .  Frequency of Social Gatherings with Friends and Family: Not on file  . Attends Religious Services: Not on file  . Active Member of Clubs or Organizations: Not on file  . Attends Archivist Meetings: Not on file  . Marital Status: Not on file   Additional Social History:    Allergies:  No Known Allergies  Labs:  Results for orders placed or performed during the hospital encounter of 08/06/19 (from the past 48 hour(s))  Comprehensive metabolic panel     Status: Abnormal   Collection Time: 08/06/19  1:51 AM  Result Value Ref Range   Sodium 138 135 - 145 mmol/L   Potassium 3.9 3.5 - 5.1 mmol/L   Chloride 101 98 - 111 mmol/L   CO2 25 22 - 32 mmol/L   Glucose, Bld 110 (H) 70 - 99 mg/dL   BUN 14 6 - 20 mg/dL   Creatinine, Ser 1.36 (H) 0.61 - 1.24 mg/dL   Calcium 9.6 8.9 - 10.3 mg/dL   Total Protein 8.6 (H) 6.5 - 8.1 g/dL   Albumin 4.9 3.5 - 5.0 g/dL   AST 125 (H) 15 - 41 U/L   ALT 247 (H) 0 - 44 U/L   Alkaline Phosphatase 113 38 - 126 U/L   Total Bilirubin 1.0 0.3 - 1.2 mg/dL   GFR calc non Af Amer >60 >60 mL/min   GFR calc Af Amer >60 >60 mL/min   Anion gap 12 5 - 15    Comment: Performed at [facility], [address] [place], [place] [address]  Ethanol     Status: Abnormal   Collection Time: 08/06/19  1:51 AM  Result Value Ref Range   Alcohol, Ethyl (B) 11 (H) <10 mg/dL    Comment: (NOTE) Lowest detectable limit for serum alcohol is 10 mg/dL. For medical purposes only. Performed at [facility], [address] [place], [place] [address]   Salicylate level     Status: Abnormal   Collection Time: 08/06/19  1:51 AM  Result Value Ref Range   Salicylate Lvl <0.9 (L) 7.0 - 30.0 mg/dL    Comment: Performed at [facility], [address] [place], [place] [address]  Acetaminophen level     Status: Abnormal   Collection Time: 08/06/19  1:51 AM  Result Value Ref Range   Acetaminophen (Tylenol), Serum <10 (L) 10 - 30 ug/mL    Comment:  (NOTE) Therapeutic concentrations vary significantly. A range of 10-30 ug/mL  may be an effective concentration for many patients. However, some  are best treated at concentrations outside of this range. Acetaminophen concentrations >150 ug/mL at 4 hours after ingestion  and >50 ug/mL at 12 hours after ingestion are often associated with  toxic reactions. Performed  at [facility], [address] [place], [place] [address]   cbc     Status: Abnormal   Collection Time: 08/06/19  1:51 AM  Result Value Ref Range   WBC 17.4 (H) 4.0 - 10.5 K/uL   RBC 4.91 4.22 - 5.81 MIL/uL   Hemoglobin 14.3 13.0 - 17.0 g/dL   HCT 53.2 99.2 - 42.6 %   MCV 87.6 80.0 - 100.0 fL   MCH 29.1 26.0 - 34.0 pg   MCHC 33.3 30.0 - 36.0 g/dL   RDW 83.4 19.6 - 22.2 %   Platelets 426 (H) 150 - 400 K/uL   nRBC 0.0 0.0 - 0.2 %    Comment: Performed at [facility], [address] [place], [place] [address]  Urine Drug Screen, Qualitative     Status: Abnormal   Collection Time: 08/06/19  1:54 AM  Result Value Ref Range   Tricyclic, Ur Screen NONE DETECTED NONE DETECTED   Amphetamines, Ur Screen NONE DETECTED NONE DETECTED   MDMA (Ecstasy)Ur Screen NONE DETECTED NONE DETECTED   Cocaine Metabolite,Ur [place] POSITIVE (A) NONE DETECTED   Opiate, Ur Screen POSITIVE (A) NONE DETECTED   Phencyclidine (PCP) Ur S NONE DETECTED NONE DETECTED   Cannabinoid 50 Ng, Ur [place] POSITIVE (A) NONE DETECTED   Barbiturates, Ur Screen NONE DETECTED NONE DETECTED   Benzodiazepine, Ur Scrn NONE DETECTED NONE DETECTED   Methadone Scn, Ur NONE DETECTED NONE DETECTED    Comment: (NOTE) Tricyclics + metabolites, urine    Cutoff 1000 ng/mL Amphetamines + metabolites, urine  Cutoff 1000 ng/mL MDMA (Ecstasy), urine              Cutoff 500 ng/mL Cocaine Metabolite, urine          Cutoff 300 ng/mL Opiate + metabolites, urine        Cutoff 300 ng/mL Phencyclidine (PCP), urine         Cutoff 25 ng/mL Cannabinoid, urine                  Cutoff 50 ng/mL Barbiturates + metabolites, urine  Cutoff 200 ng/mL Benzodiazepine, urine              Cutoff 200 ng/mL Methadone, urine                   Cutoff 300 ng/mL The urine drug screen provides only a preliminary, unconfirmed analytical test result and should not be used for non-medical purposes. Clinical consideration and professional judgment should be applied to any positive drug screen result due to possible interfering substances. A more specific alternate chemical method must be used in order to obtain a confirmed analytical result. Gas chromatography / mass spectrometry (GC/MS) is the preferred confirmat ory method. Performed at [facility], [address] [place], [place] [address]     No current facility-administered medications for this encounter.   Current Outpatient Medications  Medication Sig Dispense Refill  . amoxicillin (AMOXIL) 500 MG capsule Take 1 capsule (500 mg total) by mouth 3 (three) times daily. 30 capsule 0  . cloNIDine (CATAPRES) 0.1 MG tablet Take 1 tablet (0.1 mg total) by mouth 3 (three) times daily as needed (withdrawal symptoms). 30 tablet 0  . cyclobenzaprine (FLEXERIL) 10 MG tablet Take 1 tablet (10 mg total) by mouth 3 (three) times daily as needed for muscle spasms. 30 tablet 0  . HYDROcodone-acetaminophen (NORCO/VICODIN) 5-325 MG tablet Take 1 tablet by mouth every 4 (four) hours as needed for moderate  pain. 8 tablet 0  . ibuprofen (ADVIL,MOTRIN) 600 MG tablet Take 1 tablet (600 mg total) by mouth every 8 (eight) hours as needed. 30 tablet 0  . lidocaine (XYLOCAINE) 2 % solution Apply small amount to cotton ball and apply to tooth prn pain 30 mL 0  . omeprazole (PRILOSEC) 20 MG capsule Take 1 capsule (20 mg total) by mouth 2 (two) times daily. 60 capsule 1  . ondansetron (ZOFRAN ODT) 4 MG disintegrating tablet Allow 1-2 tablets to dissolve in your mouth every 8 hours as needed for nausea/vomiting 30 tablet 0     Musculoskeletal: Strength & Muscle Tone: within normal limits Gait & Station: normal Patient leans: N/A  Psychiatric Specialty Exam: Physical Exam  Nursing note and vitals reviewed. Constitutional: He is oriented to person, place, and time. He appears well-developed.  HENT:  Head: Normocephalic.  Eyes: Pupils are equal, round, and reactive to light.  Respiratory: Effort normal.  Musculoskeletal:        General: Normal range of motion.     Cervical back: Normal range of motion.  Neurological: He is alert and oriented to person, place, and time.  Skin: Skin is warm and dry.  Psychiatric: He has a normal mood and affect. His speech is normal and behavior is normal. Judgment and thought content normal. Cognition and memory are normal.    Review of Systems  Psychiatric/Behavioral: Negative for behavioral problems, hallucinations, self-injury and suicidal ideas.  All other systems reviewed and are negative.   Blood pressure (!) 145/82, pulse (!) 113, temperature 98.6 F (37 C), temperature source Oral, resp. rate 16, height 6' (1.829 m), weight 70.3 kg, SpO2 94 %.Body mass index is 21.02 kg/m.  General Appearance: Casual  Eye Contact:  Good  Speech:  Clear and Coherent  Volume:  Normal  Mood:  Euthymic  Affect:  Congruent  Thought Process:  Coherent and Descriptions of Associations: Intact  Orientation:  Full (Time, Place, and Person)  Thought Content:  WDL and Logical  Suicidal Thoughts:  No  Homicidal Thoughts:  No  Memory:  Recent;   Good  Judgement:  Good  Insight:  Good  Psychomotor Activity:  Normal  Concentration:  Concentration: Good  Recall:  Good  Fund of Knowledge:  Good  Language:  Good  Akathisia:  NA  Handed:  Right  AIMS (if indicated):     Assets:  Communication Skills Desire for Improvement Resilience  ADL's:  Intact  Cognition:  WNL  Sleep:        Treatment Plan Summary: Plan reassess the discharge if remains psychiatrically stable    Disposition: No evidence of imminent risk to self or others at present.   Patient does not meet criteria for psychiatric inpatient admission. Reassess in this am and discharge if psyciatrically stable  Jearld Lesch, NP 08/06/2019 6:34 AM

## 2019-08-06 NOTE — ED Triage Notes (Signed)
Patient to ED with [place] [employer] [employer] under IVC.  Patient states "I was acting a fool on heroin and they think I'm a danger to myself or others."

## 2019-08-06 NOTE — ED Notes (Signed)
Patient voiced understanding of discharge instructions and all belongings given back to Patient, Patient called His wife to come transport back home, Patient without signs of distress.

## 2019-08-06 NOTE — ED Notes (Signed)
Patient ask for phone to call about His job, states that He needed to let them know that He was in the hospital, Patient is pleasant and cooperative, will continue to monitor.

## 2019-08-06 NOTE — ED Notes (Signed)
Patient belongings placed in labeled belongings bag to be secured on unit.  1 gray colored long sleeve shirt, 1 pair jeans, 1 pair plaid boxers, 1 pair white colored socks, 1 pair gray colored tennis shoes.

## 2019-08-06 NOTE — ED Provider Notes (Signed)
[facility] Emergency Department Provider Note   ____________________________________________   First MD Initiated Contact with Patient 08/06/19 0602     (approximate)  I have reviewed the triage vital signs and the nursing notes.   HISTORY  Chief Complaint Psychiatric Evaluation    HPI Brian Decker. is a 24 y.o. male brought to the ED under IVC with police for substance use, delusions and stabbing walls with a knife.  Patient currently denies SI/HI/AH/VH.  Voices no medical complaints.       Past medical history Substance use  There are no problems to display for this patient.   No past surgical history on file.  Prior to Admission medications   Medication Sig Start Date End Date Taking? Authorizing Provider  amoxicillin (AMOXIL) 500 MG capsule Take 1 capsule (500 mg total) by mouth 3 (three) times daily. 11/29/16   Enid Derry, PA-C  cloNIDine (CATAPRES) 0.1 MG tablet Take 1 tablet (0.1 mg total) by mouth 3 (three) times daily as needed (withdrawal symptoms). 11/06/17 11/06/18  Merrily Brittle, MD  cyclobenzaprine (FLEXERIL) 10 MG tablet Take 1 tablet (10 mg total) by mouth 3 (three) times daily as needed for muscle spasms. 11/06/17   Merrily Brittle, MD  HYDROcodone-acetaminophen (NORCO/VICODIN) 5-325 MG tablet Take 1 tablet by mouth every 4 (four) hours as needed for moderate pain. 12/20/15   Cuthriell, Delorise Royals, PA-C  ibuprofen (ADVIL,MOTRIN) 600 MG tablet Take 1 tablet (600 mg total) by mouth every 8 (eight) hours as needed. 11/06/17   Merrily Brittle, MD  lidocaine (XYLOCAINE) 2 % solution Apply small amount to cotton ball and apply to tooth prn pain 12/19/15   Tommi Rumps, PA-C  omeprazole (PRILOSEC) 20 MG capsule Take 1 capsule (20 mg total) by mouth 2 (two) times daily. 07/01/17 07/01/18  Loleta Rose, MD  ondansetron (ZOFRAN ODT) 4 MG disintegrating tablet Allow 1-2 tablets to dissolve in your mouth every 8 hours as needed for  nausea/vomiting 07/01/17   Loleta Rose, MD    Allergies Patient has no known allergies.  No family history on file.  Social History Social History   Tobacco Use  . Smoking status: Current Every Day Smoker    Packs/day: 0.50    Types: Cigarettes  . Smokeless tobacco: Never Used  Substance Use Topics  . Alcohol use: Yes    Comment: occ  . Drug use: Yes    Types: Marijuana, IV    Comment: heroin    Review of Systems  Constitutional: No fever/chills Eyes: No visual changes. ENT: No sore throat. Cardiovascular: Denies chest pain. Respiratory: Denies shortness of breath. Gastrointestinal: No abdominal pain.  No nausea, no vomiting.  No diarrhea.  No constipation. Genitourinary: Negative for dysuria. Musculoskeletal: Negative for back pain. Skin: Negative for rash. Neurological: Negative for headaches, focal weakness or numbness. Psychiatric:  Positive for substance use and erratic behavior.  ____________________________________________   PHYSICAL EXAM:  VITAL SIGNS: ED Triage Vitals  Enc Vitals Group     BP 08/06/19 0141 (!) 145/82     Pulse Rate 08/06/19 0141 (!) 113     Resp 08/06/19 0141 16     Temp 08/06/19 0141 98.6 F (37 C)     Temp Source 08/06/19 0141 Oral     SpO2 08/06/19 0141 94 %     Weight 08/06/19 0140 155 lb (70.3 kg)     Height 08/06/19 0140 6' (1.829 m)     Head Circumference --  Peak Flow --      Pain Score 08/06/19 0140 0     Pain Loc --      Pain Edu? --      Excl. in [place]? --     Constitutional: Alert and oriented.  Disheveled appearing and in no acute distress. Eyes: Conjunctivae are normal. PERRL. EOMI. Head: Atraumatic. Nose: No congestion/rhinnorhea. Mouth/Throat: Mucous membranes are moist.  Oropharynx non-erythematous. Neck: No stridor.   Cardiovascular: Normal rate, regular rhythm. Grossly normal heart sounds.  Good peripheral circulation. Respiratory: Normal respiratory effort.  No retractions. Lungs  CTAB. Gastrointestinal: Soft and nontender. No distention. No abdominal bruits. No CVA tenderness. Musculoskeletal: No lower extremity tenderness nor edema.  No joint effusions. Neurologic:  Normal speech and language. No gross focal neurologic deficits are appreciated. No gait instability. Skin:  Skin is warm, dry and intact. No rash noted. Psychiatric: Mood and affect are normal. Speech and behavior are normal.  ____________________________________________   LABS (all labs ordered are listed, but only abnormal results are displayed)  Labs Reviewed  COMPREHENSIVE METABOLIC PANEL - Abnormal; Notable for the following components:      Result Value   Glucose, Bld 110 (*)    Creatinine, Ser 1.36 (*)    Total Protein 8.6 (*)    AST 125 (*)    ALT 247 (*)    All other components within normal limits  ETHANOL - Abnormal; Notable for the following components:   Alcohol, Ethyl (B) 11 (*)    All other components within normal limits  SALICYLATE LEVEL - Abnormal; Notable for the following components:   Salicylate Lvl <4.1 (*)    All other components within normal limits  ACETAMINOPHEN LEVEL - Abnormal; Notable for the following components:   Acetaminophen (Tylenol), Serum <10 (*)    All other components within normal limits  CBC - Abnormal; Notable for the following components:   WBC 17.4 (*)    Platelets 426 (*)    All other components within normal limits  URINE DRUG SCREEN, QUALITATIVE ([facility] ONLY) - Abnormal; Notable for the following components:   Cocaine Metabolite,Ur [place] POSITIVE (*)    Opiate, Ur Screen POSITIVE (*)    Cannabinoid 50 Ng, Ur [place] POSITIVE (*)    All other components within normal limits   ____________________________________________  EKG  None ____________________________________________  RADIOLOGY  ED MD interpretation: None  Official radiology report(s): No results found.  ____________________________________________   PROCEDURES  Procedure(s)  performed (including Critical Care):  Procedures   ____________________________________________   INITIAL IMPRESSION / ASSESSMENT AND PLAN / ED COURSE  As part of my medical decision making, I reviewed the following data within the [place] notes reviewed and incorporated, Labs reviewed, EKG interpreted, Old chart reviewed, A consult was requested and obtained from this/these consultant(s) Psychiatry and Notes from prior ED visits     Santina Evans. was evaluated in Emergency Department on 08/06/2019 for the symptoms described in the history of present illness. He was evaluated in the context of the global COVID-19 pandemic, which necessitated consideration that the patient might be at risk for infection with the SARS-CoV-2 virus that causes COVID-19. Institutional protocols and algorithms that pertain to the evaluation of patients at risk for COVID-19 are in a state of rapid change based on information released by regulatory bodies including the CDC and federal and state organizations. These policies and algorithms were followed during the patient's care in the ED.    24 year old male brought in  under IVC for substance use, delusions and stabbing walls with a knife.  Laboratory results remarkable for leukocytosis, elevated transaminases and tox screen positive for cocaine, opiates and cannabinoids.  Leukocytosis and elevated transaminases most likely elevated secondary to substance use.  Patient denies fever, cough or dysuria.  Will maintain patient under IVC pending psychiatric evaluation and disposition.      ____________________________________________   FINAL CLINICAL IMPRESSION(S) / ED DIAGNOSES  Final diagnoses:  Cocaine use  Marijuana use     ED Discharge Orders    None       Note:  This document was prepared using Dragon voice recognition software and may include unintentional dictation errors.   Irean Hong, MD 08/06/19 [phone]

## 2019-12-09 ENCOUNTER — Emergency Department
Admission: EM | Admit: 2019-12-09 | Discharge: 2019-12-10 | Disposition: A | Discharge status: Home / Self Care | Payer: Self-pay | Attending: Emergency Medicine | Admitting: Emergency Medicine

## 2019-12-09 ENCOUNTER — Encounter: Payer: Self-pay | Admitting: Emergency Medicine

## 2019-12-09 ENCOUNTER — Other Ambulatory Visit: Payer: Self-pay

## 2019-12-09 ENCOUNTER — Emergency Department: Payer: Self-pay

## 2019-12-09 DIAGNOSIS — Y9389 Activity, other specified: Secondary | ICD-10-CM | POA: Insufficient documentation

## 2019-12-09 DIAGNOSIS — Z20822 Contact with and (suspected) exposure to covid-19: Secondary | ICD-10-CM | POA: Insufficient documentation

## 2019-12-09 DIAGNOSIS — R4689 Other symptoms and signs involving appearance and behavior: Secondary | ICD-10-CM

## 2019-12-09 DIAGNOSIS — Y929 Unspecified place or not applicable: Secondary | ICD-10-CM | POA: Insufficient documentation

## 2019-12-09 DIAGNOSIS — M545 Low back pain: Secondary | ICD-10-CM | POA: Insufficient documentation

## 2019-12-09 DIAGNOSIS — R45851 Suicidal ideations: Secondary | ICD-10-CM | POA: Insufficient documentation

## 2019-12-09 DIAGNOSIS — F17210 Nicotine dependence, cigarettes, uncomplicated: Secondary | ICD-10-CM | POA: Insufficient documentation

## 2019-12-09 DIAGNOSIS — Y999 Unspecified external cause status: Secondary | ICD-10-CM | POA: Insufficient documentation

## 2019-12-09 DIAGNOSIS — F1910 Other psychoactive substance abuse, uncomplicated: Secondary | ICD-10-CM | POA: Insufficient documentation

## 2019-12-09 HISTORY — DX: Unspecified asthma, uncomplicated: J45.909

## 2019-12-09 LAB — URINALYSIS, COMPLETE (UACMP) WITH MICROSCOPIC
Bacteria, UA: NONE SEEN
Bilirubin Urine: NEGATIVE
Glucose, UA: NEGATIVE mg/dL
Hgb urine dipstick: NEGATIVE
Ketones, ur: NEGATIVE mg/dL
Leukocytes,Ua: NEGATIVE
Nitrite: NEGATIVE
Protein, ur: 30 mg/dL — AB
Specific Gravity, Urine: 1.028 (ref 1.005–1.030)
pH: 5 (ref 5.0–8.0)

## 2019-12-09 LAB — COMPREHENSIVE METABOLIC PANEL
ALT: 54 U/L — ABNORMAL HIGH (ref 0–44)
AST: 55 U/L — ABNORMAL HIGH (ref 15–41)
Albumin: 4.9 g/dL (ref 3.5–5.0)
Alkaline Phosphatase: 78 U/L (ref 38–126)
Anion gap: 10 (ref 5–15)
BUN: 11 mg/dL (ref 6–20)
CO2: 30 mmol/L (ref 22–32)
Calcium: 9.3 mg/dL (ref 8.9–10.3)
Chloride: 98 mmol/L (ref 98–111)
Creatinine, Ser: 1.12 mg/dL (ref 0.61–1.24)
GFR calc Af Amer: >60 mL/min (ref >60)
GFR calc non Af Amer: >60 mL/min (ref >60)
Glucose, Bld: 96 mg/dL (ref 70–99)
Potassium: 4.3 mmol/L (ref 3.5–5.1)
Sodium: 138 mmol/L (ref 135–145)
Total Bilirubin: 1.5 mg/dL — ABNORMAL HIGH (ref 0.3–1.2)
Total Protein: 8.3 g/dL — ABNORMAL HIGH (ref 6.5–8.1)

## 2019-12-09 LAB — CBC
HCT: 39.4 % (ref 39.0–52.0)
Hemoglobin: 13.3 g/dL (ref 13.0–17.0)
MCH: 28.7 pg (ref 26.0–34.0)
MCHC: 33.8 g/dL (ref 30.0–36.0)
MCV: 84.9 fL (ref 80.0–100.0)
Platelets: 323 10*3/uL (ref 150–400)
RBC: 4.64 MIL/uL (ref 4.22–5.81)
RDW: 12.5 % (ref 11.5–15.5)
WBC: 9.4 10*3/uL (ref 4.0–10.5)
nRBC: 0 % (ref 0.0–0.2)

## 2019-12-09 LAB — ETHANOL: Alcohol, Ethyl (B): <10 mg/dL (ref <10)

## 2019-12-09 LAB — URINE DRUG SCREEN, QUALITATIVE (ARMC ONLY)
Amphetamines, Ur Screen: POSITIVE — AB
Barbiturates, Ur Screen: NOT DETECTED
Benzodiazepine, Ur Scrn: NOT DETECTED
Cannabinoid 50 Ng, Ur ~~LOC~~: POSITIVE — AB
Cocaine Metabolite,Ur ~~LOC~~: POSITIVE — AB
MDMA (Ecstasy)Ur Screen: NOT DETECTED
Methadone Scn, Ur: NOT DETECTED
Opiate, Ur Screen: POSITIVE — AB
Phencyclidine (PCP) Ur S: NOT DETECTED
Tricyclic, Ur Screen: NOT DETECTED

## 2019-12-09 LAB — ACETAMINOPHEN LEVEL: Acetaminophen (Tylenol), Serum: <10 ug/mL — ABNORMAL LOW (ref 10–30)

## 2019-12-09 LAB — SALICYLATE LEVEL: Salicylate Lvl: <7 mg/dL — ABNORMAL LOW (ref 7.0–30.0)
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
## 2019-12-10 DIAGNOSIS — R4689 Other symptoms and signs involving appearance and behavior: Secondary | ICD-10-CM

## 2019-12-10 LAB — SARS CORONAVIRUS 2 BY RT PCR (HOSPITAL ORDER, PERFORMED IN ~~LOC~~ HOSPITAL LAB): SARS Coronavirus 2: NEGATIVE

## 2019-12-10 NOTE — ED Notes (Signed)
Pt brought into ED [facility] via sally port and wand with metal detector for safety by [employer] officer. Patient oriented to unit/care area: Pt informed of unit policies and procedures.  Informed that, for their safety, care areas are designed for safety and monitored by security cameras at all times. Patient verbalizes understanding, and verbal contract for safety obtained.Pt shown to their room.  

## 2019-12-10 NOTE — Discharge Instructions (Addendum)
You have been seen in the emergency department for a  psychiatric concern. You have been evaluated both medically as well as psychiatrically. Please follow-up with your outpatient resources provided. Return to the emergency department for any worsening symptoms, or any thoughts of hurting yourself or anyone else so that we may attempt to help you. 
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
## 2019-12-10 NOTE — ED Provider Notes (Signed)
-----------------------------------------   11:58 AM on 12/10/2019 -----------------------------------------  Patient has been seen and evaluated by psychiatry.  They believe the patient safe for discharge home from a psychiatric standpoint.  Patient's medical work-up is largely nonrevealing.   Minna Antis, MD 12/10/19 1159

## 2019-12-10 NOTE — ED Notes (Signed)
psych with pt att

## 2019-12-10 NOTE — ED Notes (Signed)
Pt given back his belongings, patient states that everything is accounted for.

## 2019-12-10 NOTE — BH Assessment (Signed)
Writer spoke with the patient to complete an updated/reassessment. Patient denies SI/HI and AV/H.  Patient does not meet inpatient criteria.   Spoke with patient's brother Apolinar Junes), only concern he have is his drug use.
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
## 2020-01-09 ENCOUNTER — Emergency Department
Admission: EM | Admit: 2020-01-09 | Discharge: 2020-01-10 | Payer: Self-pay | Attending: Emergency Medicine | Admitting: Emergency Medicine

## 2020-01-09 ENCOUNTER — Encounter: Payer: Self-pay | Admitting: Emergency Medicine

## 2020-01-09 ENCOUNTER — Emergency Department
Admission: EM | Admit: 2020-01-09 | Discharge: 2020-01-09 | Discharge status: Against Medical Advice | Payer: Self-pay | Attending: Emergency Medicine | Admitting: Emergency Medicine

## 2020-01-09 ENCOUNTER — Other Ambulatory Visit: Payer: Self-pay

## 2020-01-09 ENCOUNTER — Emergency Department: Payer: Self-pay

## 2020-01-09 DIAGNOSIS — J45909 Unspecified asthma, uncomplicated: Secondary | ICD-10-CM | POA: Insufficient documentation

## 2020-01-09 DIAGNOSIS — F17210 Nicotine dependence, cigarettes, uncomplicated: Secondary | ICD-10-CM | POA: Insufficient documentation

## 2020-01-09 DIAGNOSIS — F1910 Other psychoactive substance abuse, uncomplicated: Secondary | ICD-10-CM

## 2020-01-09 DIAGNOSIS — R569 Unspecified convulsions: Secondary | ICD-10-CM | POA: Insufficient documentation

## 2020-01-09 DIAGNOSIS — F1410 Cocaine abuse, uncomplicated: Secondary | ICD-10-CM | POA: Insufficient documentation

## 2020-01-09 DIAGNOSIS — F1110 Opioid abuse, uncomplicated: Secondary | ICD-10-CM | POA: Insufficient documentation

## 2020-01-09 DIAGNOSIS — F1210 Cannabis abuse, uncomplicated: Secondary | ICD-10-CM | POA: Insufficient documentation

## 2020-01-09 LAB — CBC WITH DIFFERENTIAL/PLATELET
Abs Immature Granulocytes: 0.05 10*3/uL (ref 0.00–0.07)
Basophils Absolute: 0.1 10*3/uL (ref 0.0–0.1)
Basophils Relative: 1 %
Eosinophils Absolute: 1.1 10*3/uL — ABNORMAL HIGH (ref 0.0–0.5)
Eosinophils Relative: 9 %
HCT: 35.1 % — ABNORMAL LOW (ref 39.0–52.0)
Hemoglobin: 11.9 g/dL — ABNORMAL LOW (ref 13.0–17.0)
Immature Granulocytes: 0 %
Lymphocytes Relative: 28 %
Lymphs Abs: 3.3 10*3/uL (ref 0.7–4.0)
MCH: 29.5 pg (ref 26.0–34.0)
MCHC: 33.9 g/dL (ref 30.0–36.0)
MCV: 86.9 fL (ref 80.0–100.0)
Monocytes Absolute: 0.9 10*3/uL (ref 0.1–1.0)
Monocytes Relative: 8 %
Neutro Abs: 6.7 10*3/uL (ref 1.7–7.7)
Neutrophils Relative %: 54 %
Platelets: 298 10*3/uL (ref 150–400)
RBC: 4.04 MIL/uL — ABNORMAL LOW (ref 4.22–5.81)
RDW: 12.9 % (ref 11.5–15.5)
WBC: 12.1 10*3/uL — ABNORMAL HIGH (ref 4.0–10.5)
nRBC: 0 % (ref 0.0–0.2)

## 2020-01-09 LAB — COMPREHENSIVE METABOLIC PANEL
ALT: 66 U/L — ABNORMAL HIGH (ref 0–44)
AST: 55 U/L — ABNORMAL HIGH (ref 15–41)
Albumin: 4.3 g/dL (ref 3.5–5.0)
Alkaline Phosphatase: 75 U/L (ref 38–126)
Anion gap: 10 (ref 5–15)
BUN: 19 mg/dL (ref 6–20)
CO2: 26 mmol/L (ref 22–32)
Calcium: 9.1 mg/dL (ref 8.9–10.3)
Chloride: 104 mmol/L (ref 98–111)
Creatinine, Ser: 1.27 mg/dL — ABNORMAL HIGH (ref 0.61–1.24)
GFR calc Af Amer: >60 mL/min (ref >60)
GFR calc non Af Amer: >60 mL/min (ref >60)
Glucose, Bld: 111 mg/dL — ABNORMAL HIGH (ref 70–99)
Potassium: 4.3 mmol/L (ref 3.5–5.1)
Sodium: 140 mmol/L (ref 135–145)
Total Bilirubin: 0.8 mg/dL (ref 0.3–1.2)
Total Protein: 7.3 g/dL (ref 6.5–8.1)

## 2020-01-09 LAB — ETHANOL: Alcohol, Ethyl (B): <10 mg/dL (ref <10)

## 2020-01-09 LAB — LACTIC ACID, PLASMA: Lactic Acid, Venous: 1.3 mmol/L (ref 0.5–1.9)

## 2020-01-09 MED ORDER — SODIUM CHLORIDE 0.9 % IV BOLUS
1000.0000 mL | Freq: Once | INTRAVENOUS | Status: AC
  Administered 2020-01-09: 1000 mL via INTRAVENOUS
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
## 2020-01-09 NOTE — ED Notes (Addendum)
This RN was informed by registration that pt walked out of room wearing hospital gown with EJ still in place.  This RN located pt walking through lobby and exiting through front lobby doors. This RN asked pt to let us remove the EJ and he can continue to leave. Pt stating, "I already removed it" and took off sprinting towards the bus station through the parking lot. This RN and staff attempted to catch up to pt but was unable to do so. This RN was unable to remove EJ prior to pt running off. Pt was seen throwing off hospital gown in parking lot. Pt is now shirtless, wearing khaki shorts, a brown belt and black shoes. This RN searched parking lot but not IV found.   Consulting civil engineer, Raquel notified. Charge RN contacted [employer] office to notify of situation and reported where pt was last seen, appearance and that pt's EJ was still in place.

## 2020-01-09 NOTE — ED Notes (Signed)
Officer called nurse to room for pt having "seizure"; Pt lying on left side clenching hands and stiffening body but when called by name, pt immed stops, sits up in bed and starts talking

## 2020-01-09 NOTE — ED Notes (Signed)
This RN at bedside. Pt was up wondering around room. This RN directed pt back into bed and stressed the importance of using the call bell if he needed anything. This RN stating the padded side rails would keep the pt safe if he had another seizure. Pt verbalized understanding.

## 2020-01-09 NOTE — ED Provider Notes (Signed)
[facility] Emergency Department Provider Note  ____________________________________________  Time seen: Approximately 11:47 PM  I have reviewed the triage vital signs and the nursing notes.   HISTORY  Chief Complaint Seizures   HPI Brian Decker. is a 24 y.o. male with a history of polysubstance abuse including IV heroin, cocaine and marijuana was brought in by police under custody for medical clearance.  Patient was brought in earlier today for the same.  He was under arrest for drug use and being taken to jail when he started seizing and EMS was called.  Patient was brought into the emergency room and was here for 3 hours when he eloped.  Police was contacted since patient was under custody and left with his IV in place.  Police found him 30 minutes later.  No IV in place.  They were taking him to jail when he said he was about to have a seizure and therefore the rerouted to the hospital.  Patient was witnessed by staff having a pseudoseizure upon arrival to the room.  Patient reports that he usually has seizures when he uses drugs but when he gets clean they go away.  Is not supposed to be on antiepileptic medications.  He denies using drugs between his 2 visits to the hospital today.  His only complaint today is that "my body can stop twitching."  He denies chest pain or shortness of breath.   Past Medical History:  Diagnosis Date   Asthma     Patient Active Problem List   Diagnosis Date Noted   Aggression 12/10/2019   Drug-induced psychotic disorder with delusions ([place]) 08/06/2019   Cocaine-induced psychotic disorder with hallucinations ([place]) 08/06/2019   Cocaine use    Marijuana use     History reviewed. No pertinent surgical history.  Prior to Admission medications   Not on File    Allergies Bee venom  No family history on file.  Social History Social History   Tobacco Use   Smoking status: Current Every Day Smoker     Packs/day: 0.50    Types: Cigarettes   Smokeless tobacco: Never Used  Vaping Use   Vaping Use: Every day  Substance Use Topics   Alcohol use: Yes    Comment: occ   Drug use: Yes    Types: Marijuana, IV    Comment: heroin    Review of Systems  Constitutional: Negative for fever. Eyes: Negative for visual changes. ENT: Negative for sore throat. Neck: No neck pain  Cardiovascular: Negative for chest pain. Respiratory: Negative for shortness of breath. Gastrointestinal: Negative for abdominal pain, vomiting or diarrhea. Genitourinary: Negative for dysuria. Musculoskeletal: Negative for back pain. Skin: Negative for rash. Neurological: Negative for headaches, weakness or numbness. Psych: No SI or HI  ____________________________________________   PHYSICAL EXAM:  VITAL SIGNS: ED Triage Vitals  Enc Vitals Group     BP 01/09/20 2256 (!) 119/102     Pulse Rate 01/09/20 2256 (!) 110     Resp 01/09/20 2256 20     Temp 01/09/20 2256 97.9 F (36.6 C)     Temp Source 01/09/20 2256 Oral     SpO2 01/09/20 2256 97 %     Weight 01/09/20 2305 169 lb 15.6 oz (77.1 kg)     Height 01/09/20 2305 6\' 1"  (1.854 m)     Head Circumference --      Peak Flow --      Pain Score 01/09/20 2305 0  Pain Loc --      Pain Edu? --      Excl. in GC? --     Constitutional: Patient is twitching  HEENT:      Head: Normocephalic and atraumatic.         Eyes: Conjunctivae are normal. Sclera is non-icteric.       Mouth/Throat: Mucous membranes are moist.       Neck: Supple with no signs of meningismus. Cardiovascular: Tachycardic with regular rhythm. Respiratory: Normal respiratory effort. Lungs are clear to auscultation bilaterally.  Gastrointestinal: Soft, non tender. Musculoskeletal: Nontender with normal range of motion in all extremities. No edema, cyanosis, or erythema of extremities.  Several track marks in all extremities Neurologic: Normal speech and language. Face is symmetric.  Moving all extremities. No gross focal neurologic deficits are appreciated. Skin: Skin is warm, dry and intact. No rash noted. Psychiatric: Mood and affect are normal.   ____________________________________________   LABS (all labs ordered are listed, but only abnormal results are displayed)  Labs Reviewed - No data to display ____________________________________________  EKG  none  ____________________________________________  RADIOLOGY  none  ____________________________________________   PROCEDURES  Procedure(s) performed: None Procedures Critical Care performed:  None ____________________________________________   INITIAL IMPRESSION / ASSESSMENT AND PLAN / ED COURSE   24 y.o. male with a history of polysubstance abuse including IV heroin, cocaine and marijuana was brought in by police under custody for medical clearance.  Patient arrested for drug use and brought to the hospital because he felt like he was going to have a seizure.  Seizure was witnessed by staff and consistent with pseudoseizures.  He does not have a history of seizure disorder.  Patient was seen here 5 hours ago for the same.  Had labs that were unremarkable, head CT and EKG.  I have reviewed all of these results with no significant abnormalities.  Mild leukocytosis in the setting of IV drug use but negative lactic acid and no fever with no signs of sepsis.  He has no murmur on auscultation.  Electrolytes unchanged from baseline.  Currently patient is tweaking possibly from acute drug use or might may be mild withdrawals.  Will give IV fluids and monitor for any signs of complicated withdrawals.  Anticipate discharge to police custody.  Old medical records reviewed.  _________________________ 1:20 AM on 01/10/2020 -----------------------------------------  Patient monitored for 2.5 hours with no seizures or signs of complicated withdrawal.  Will be discharged in custody of police.  Discussed return  precautions. Vitals:   01/10/20 0030 01/10/20 0100  BP: (!) 103/51 115/66  Pulse:  62  Resp: 11 16  Temp:    SpO2:  99%      _____________________________________________ Please note:  Patient was evaluated in Emergency Department today for the symptoms described in the history of present illness. Patient was evaluated in the context of the global COVID-19 pandemic, which necessitated consideration that the patient might be at risk for infection with the SARS-CoV-2 virus that causes COVID-19. Institutional protocols and algorithms that pertain to the evaluation of patients at risk for COVID-19 are in a state of rapid change based on information released by regulatory bodies including the CDC and federal and state organizations. These policies and algorithms were followed during the patient's care in the ED.  Some ED evaluations and interventions may be delayed as a result of limited staffing during the pandemic.   [place] Controlled Substance Database was reviewed by me. ____________________________________________   FINAL CLINICAL IMPRESSION(S) / ED  DIAGNOSES   Final diagnoses:  Polysubstance abuse (HCC)      NEW MEDICATIONS STARTED DURING THIS VISIT:  ED Discharge Orders    None       Note:  This document was prepared using Dragon voice recognition software and may include unintentional dictation errors.    Don Perking, [place], MD 01/10/20 [phone]

## 2020-01-09 NOTE — ED Notes (Signed)
This RN at bedside. Pt attempting to get out of bed at this time. Pt redirected back in bed by this RN. Pt stating, "I just wanted some more water." This RN provided pt with more water and assured pt to hit the call bell if he needed anything.

## 2020-01-09 NOTE — ED Triage Notes (Addendum)
Pt to room 2 via w/c, awake, alert and talking; st he has hx seizures "when he uses drugs"; pt in custody of [place] [employer] and was enroute to jail when he reportedly c/o impending seizure

## 2020-01-09 NOTE — ED Triage Notes (Signed)
Pt via EMS, pt was found at [facility]. Per EMS, pt had two seizures with them. Pt given 2 of Versed. Pt has a hx of seizures. Pt admits to heroin use 2 hours ago and meth less that 24 hours ago. On arrival, pt is restless and lethargic on arrival. Pt able to answer questions. Pt A&Ox4

## 2020-01-10 NOTE — ED Notes (Signed)
Pt resting quietly with eyes closed; resp even/unlab with no distress noted; warm blanket given for comfort; officer remains at bedside

## 2020-03-26 ENCOUNTER — Other Ambulatory Visit: Payer: Self-pay

## 2020-03-26 DIAGNOSIS — F1910 Other psychoactive substance abuse, uncomplicated: Secondary | ICD-10-CM | POA: Insufficient documentation

## 2020-03-26 DIAGNOSIS — F17210 Nicotine dependence, cigarettes, uncomplicated: Secondary | ICD-10-CM | POA: Insufficient documentation

## 2020-03-26 DIAGNOSIS — J45909 Unspecified asthma, uncomplicated: Secondary | ICD-10-CM | POA: Insufficient documentation

## 2020-03-26 DIAGNOSIS — L0100 Impetigo, unspecified: Secondary | ICD-10-CM | POA: Insufficient documentation

## 2020-03-26 LAB — CBC
HCT: 32.2 % — ABNORMAL LOW (ref 39.0–52.0)
Hemoglobin: 11.2 g/dL — ABNORMAL LOW (ref 13.0–17.0)
MCH: 30.3 pg (ref 26.0–34.0)
MCHC: 34.8 g/dL (ref 30.0–36.0)
MCV: 87 fL (ref 80.0–100.0)
Platelets: 288 10*3/uL (ref 150–400)
RBC: 3.7 MIL/uL — ABNORMAL LOW (ref 4.22–5.81)
RDW: 13.4 % (ref 11.5–15.5)
WBC: 11.8 10*3/uL — ABNORMAL HIGH (ref 4.0–10.5)
nRBC: 0 % (ref 0.0–0.2)

## 2020-03-26 NOTE — ED Triage Notes (Signed)
Pt to ED with a [employer] officer. PT states he is "ready to quit doing drugs" but upon further questions pt says officer told him if he did not come to the hospital then the cop would stick him with a cocaine charge. PT restless. Also states his nose is scabbed up and right side of his face is swollen.

## 2020-03-27 ENCOUNTER — Emergency Department
Admission: EM | Admit: 2020-03-27 | Discharge: 2020-03-27 | Disposition: A | Discharge status: Home / Self Care | Payer: Self-pay | Attending: Emergency Medicine | Admitting: Emergency Medicine

## 2020-03-27 DIAGNOSIS — L0100 Impetigo, unspecified: Secondary | ICD-10-CM

## 2020-03-27 DIAGNOSIS — F1910 Other psychoactive substance abuse, uncomplicated: Secondary | ICD-10-CM

## 2020-03-27 LAB — URINE DRUG SCREEN, QUALITATIVE (ARMC ONLY)
Amphetamines, Ur Screen: POSITIVE — AB
Barbiturates, Ur Screen: NOT DETECTED
Benzodiazepine, Ur Scrn: NOT DETECTED
Cannabinoid 50 Ng, Ur ~~LOC~~: POSITIVE — AB
Cocaine Metabolite,Ur ~~LOC~~: POSITIVE — AB
MDMA (Ecstasy)Ur Screen: NOT DETECTED
Methadone Scn, Ur: NOT DETECTED
Opiate, Ur Screen: POSITIVE — AB
Phencyclidine (PCP) Ur S: NOT DETECTED
Tricyclic, Ur Screen: NOT DETECTED

## 2020-03-27 LAB — COMPREHENSIVE METABOLIC PANEL
ALT: 74 U/L — ABNORMAL HIGH (ref 0–44)
AST: 63 U/L — ABNORMAL HIGH (ref 15–41)
Albumin: 4.4 g/dL (ref 3.5–5.0)
Alkaline Phosphatase: 69 U/L (ref 38–126)
Anion gap: 10 (ref 5–15)
BUN: 7 mg/dL (ref 6–20)
CO2: 30 mmol/L (ref 22–32)
Calcium: 9.1 mg/dL (ref 8.9–10.3)
Chloride: 99 mmol/L (ref 98–111)
Creatinine, Ser: 1.09 mg/dL (ref 0.61–1.24)
GFR calc Af Amer: >60 mL/min (ref >60)
GFR calc non Af Amer: >60 mL/min (ref >60)
Glucose, Bld: 95 mg/dL (ref 70–99)
Potassium: 3.7 mmol/L (ref 3.5–5.1)
Sodium: 139 mmol/L (ref 135–145)
Total Bilirubin: 1.1 mg/dL (ref 0.3–1.2)
Total Protein: 7.3 g/dL (ref 6.5–8.1)

## 2020-03-27 LAB — ETHANOL: Alcohol, Ethyl (B): <10 mg/dL (ref <10)

## 2020-03-27 MED ORDER — MUPIROCIN CALCIUM 2 % EX CREA
TOPICAL_CREAM | Freq: Once | CUTANEOUS | Status: AC
  Filled 2020-03-27: qty 15

## 2020-03-27 MED ORDER — CEPHALEXIN 500 MG PO CAPS
500.0000 mg | ORAL_CAPSULE | Freq: Three times a day (TID) | ORAL | 0 refills | Status: AC
Start: 2020-03-27 — End: 2020-04-03

## 2020-03-27 MED ORDER — MUPIROCIN 2 % EX OINT: TOPICAL_OINTMENT | CUTANEOUS | 0 refills | Status: DC

## 2020-03-27 MED ORDER — CEPHALEXIN 500 MG PO CAPS
500.0000 mg | ORAL_CAPSULE | Freq: Once | ORAL | Status: AC
  Administered 2020-03-27: 500 mg via ORAL
  Filled 2020-03-27: qty 1

## 2020-03-27 NOTE — ED Notes (Signed)
Discharge paperwork reviewed with patient. Patient voiced understanding.  

## 2020-03-27 NOTE — ED Provider Notes (Addendum)
[facility] Emergency Department Provider Note  ____________________________________________  Time seen: Approximately 12:50 AM  I have reviewed the triage vital signs and the nursing notes.   HISTORY  Chief Complaint Drug Problem and Facial Swelling   HPI Brian Decker. is a 24 y.o. male with a history of polysubstance abuse brought in by police for medical clearance.  Patient reports that the cops found him "tweaking in public" and told him that he could come to the emergency room or go to jail.   Patient reports that he did not want to be stuck with a cocaine charger therefore he decided to come in to be evaluated.  His only medical complaint is swelling of the right part of his nose because he has been picking on it.  He does endorse polysubstance use including IV drug use.  He denies suicidal homicidal ideation.  He does not want any help quitting at this time.  Patient is requesting to be discharged none to the police officers have left.  Past Medical History:  Diagnosis Date  . Asthma     Patient Active Problem List   Diagnosis Date Noted  . Aggression 12/10/2019  . 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 Cocaine use   . Marijuana use     History reviewed. No pertinent surgical history.  Prior to Admission medications   Medication Sig Start Date End Date Taking? Authorizing Provider  cephALEXin (KEFLEX) 500 MG capsule Take 1 capsule (500 mg total) by mouth 3 (three) times daily for 7 days. 03/27/20 04/03/20  Nita Sickle, MD  mupirocin ointment Idelle Jo) 2 % Apply to affected area 3 times daily 03/27/20 03/27/21  Nita Sickle, MD    Allergies Bee venom  No family history on file.  Social History Social History   Tobacco Use  . Smoking status: Current Every Day Smoker    Packs/day: 0.50    Types: Cigarettes  . Smokeless  tobacco: Never Used  Vaping Use  . Vaping Use: Every day  Substance Use Topics  . Alcohol use: Yes    Comment: occ  . Drug use: Yes    Types: Marijuana, IV, Cocaine    Comment: heroin    Review of Systems  Constitutional: Negative for fever. Eyes: Negative for visual changes. ENT: Negative for sore throat. + nose swelling Neck: No neck pain  Cardiovascular: Negative for chest pain. Respiratory: Negative for shortness of breath. Gastrointestinal: Negative for abdominal pain, vomiting or diarrhea. Genitourinary: Negative for dysuria. Musculoskeletal: Negative for back pain. Skin: Negative for rash. Neurological: Negative for headaches, weakness or numbness. Psych: No SI or HI  ____________________________________________   PHYSICAL EXAM:  VITAL SIGNS: ED Triage Vitals  Enc Vitals Group     BP 03/26/20 2334 (!) 123/56     Pulse Rate 03/26/20 2334 (!) 128     Resp 03/26/20 2334 (!) 24     Temp 03/26/20 2334 98.5 F (36.9 C)     Temp Source 03/26/20 2334 Oral     SpO2 03/26/20 2334 96 %     Weight 03/26/20 2335 135 lb (61.2 kg)     Height 03/26/20 2335 6' (1.829 m)     Head Circumference --      Peak Flow --      Pain Score 03/26/20 2335 6     Pain Loc --      Pain Edu? --  Excl. in GC? --     Constitutional: Alert and oriented, patient tweaking.  HEENT:      Head: Normocephalic and atraumatic.         Eyes: Conjunctivae are normal. Sclera is non-icteric.       Nose: R side of the nose is inflamed, erythematous, with crusting on it      Mouth/Throat: Mucous membranes are moist.       Neck: Supple with no signs of meningismus. Cardiovascular: Tachycardic with regular rhythm.  Respiratory: Normal respiratory effort.  Gastrointestinal: Soft, non tender, and non distended. Musculoskeletal: No edema, cyanosis, or erythema of extremities. Neurologic: Normal speech and language. Face is symmetric. Moving all extremities. No gross focal neurologic deficits are  appreciated. Skin: Skin is warm, dry and intact. No rash noted. Psychiatric: Mood and affect are normal. Speech and behavior are normal.  ____________________________________________   LABS (all labs ordered are listed, but only abnormal results are displayed)  Labs Reviewed  COMPREHENSIVE METABOLIC PANEL - Abnormal; Notable for the following components:      Result Value   AST 63 (*)    ALT 74 (*)    All other components within normal limits  CBC - Abnormal; Notable for the following components:   WBC 11.8 (*)    RBC 3.70 (*)    Hemoglobin 11.2 (*)    HCT 32.2 (*)    All other components within normal limits  URINE DRUG SCREEN, QUALITATIVE ([facility] ONLY) - Abnormal; Notable for the following components:   Amphetamines, Ur Screen POSITIVE (*)    Cocaine Metabolite,Ur [place] POSITIVE (*)    Opiate, Ur Screen POSITIVE (*)    Cannabinoid 50 Ng, Ur [place] POSITIVE (*)    All other components within normal limits  ETHANOL   ____________________________________________  EKG  none  ____________________________________________  RADIOLOGY  none  ____________________________________________   PROCEDURES  Procedure(s) performed: None Procedures Critical Care performed:  None ____________________________________________   INITIAL IMPRESSION / ASSESSMENT AND PLAN / ED COURSE  24 y.o. male with a history of polysubstance abuse brought in by police for medical clearance.  Patient is here because he was given the choice to come to the emergency room or go to jail for drug charges.  He does not wish help at this time.  Denies any SI or HI.  Drug screen is positive for amphetamines, cocaine, opiates, and cannabinoids.  Patient does not meet criteria for IVC.  As soon as the cops left patient is requesting to be discharged.  I requested the patient have a sober ride here to pick him up.  He does have impetigo of the right nose.  I gave him mupirocin and Keflex.  He will be discharged home on  both prescriptions.  Discussed my standard return precautions and follow-up with his doctor.  Old medical records reviewed    _________________________ 3:47 AM on 03/27/2020 -----------------------------------------  Patient unable to find a ride to go home.  Was monitored in the ED for 4.5 hours.  Is now clinically sober, ambulated with no difficulty, tolerating p.o.  Patient is safe for discharge   Please note:  Patient was evaluated in Emergency Department today for the symptoms described in the history of present illness. Patient was evaluated in the context of the global COVID-19 pandemic, which necessitated consideration that the patient might be at risk for infection with the SARS-CoV-2 virus that causes COVID-19. Institutional protocols and algorithms that pertain to the evaluation of patients at risk for COVID-19 are  in a state of rapid change based on information released by regulatory bodies including the CDC and federal and state organizations. These policies and algorithms were followed during the patient's care in the ED.  Some ED evaluations and interventions may be delayed as a result of limited staffing during the pandemic.   ____________________________________________   FINAL CLINICAL IMPRESSION(S) / ED DIAGNOSES   Final diagnoses:  Polysubstance abuse (HCC)  Impetigo      NEW MEDICATIONS STARTED DURING THIS VISIT:  ED Discharge Orders         Ordered    cephALEXin (KEFLEX) 500 MG capsule  3 times daily        03/27/20 0054    mupirocin ointment (BACTROBAN) 2 %        03/27/20 0054           Note:  This document was prepared using Dragon voice recognition software and may include unintentional dictation errors.    Don Perking, [place], MD 03/27/20 5537    Nita Sickle, MD 03/27/20 [phone]

## 2020-03-27 NOTE — Discharge Instructions (Signed)
You have been seen in the Emergency Department (ED)  today for a psychiatric complaint.  You have been evaluated by psychiatry and we believe you are safe to be discharged from the hospital.   ° °Please return to the Emergency Department (ED)  immediately if you have ANY thoughts of hurting yourself or anyone else, so that we may help you. ° °Please avoid alcohol and drug use. ° °Follow up with your doctor and/or therapist as soon as possible regarding today's ED  visit.  ° °You may call crisis hotline for [place] at [phone]. ° °

## 2020-04-07 ENCOUNTER — Other Ambulatory Visit: Payer: Self-pay

## 2020-04-07 ENCOUNTER — Emergency Department
Admission: EM | Admit: 2020-04-07 | Discharge: 2020-04-07 | Disposition: A | Discharge status: Home / Self Care | Payer: Self-pay | Attending: Emergency Medicine | Admitting: Emergency Medicine

## 2020-04-07 ENCOUNTER — Emergency Department: Payer: Self-pay

## 2020-04-07 DIAGNOSIS — F1990 Other psychoactive substance use, unspecified, uncomplicated: Secondary | ICD-10-CM

## 2020-04-07 DIAGNOSIS — F17210 Nicotine dependence, cigarettes, uncomplicated: Secondary | ICD-10-CM | POA: Insufficient documentation

## 2020-04-07 DIAGNOSIS — L03114 Cellulitis of left upper limb: Secondary | ICD-10-CM

## 2020-04-07 DIAGNOSIS — F1510 Other stimulant abuse, uncomplicated: Secondary | ICD-10-CM

## 2020-04-07 DIAGNOSIS — F1110 Opioid abuse, uncomplicated: Secondary | ICD-10-CM

## 2020-04-07 DIAGNOSIS — J45909 Unspecified asthma, uncomplicated: Secondary | ICD-10-CM | POA: Insufficient documentation

## 2020-04-07 HISTORY — DX: Other stimulant abuse, uncomplicated: F15.10

## 2020-04-07 HISTORY — DX: Opioid abuse, uncomplicated: F11.10

## 2020-04-07 HISTORY — DX: Cocaine use, unspecified, uncomplicated: F14.90

## 2020-04-07 HISTORY — DX: Cocaine abuse, uncomplicated: F14.10

## 2020-04-07 LAB — COMPREHENSIVE METABOLIC PANEL
ALT: 53 U/L — ABNORMAL HIGH (ref 0–44)
AST: 48 U/L — ABNORMAL HIGH (ref 15–41)
Albumin: 4.2 g/dL (ref 3.5–5.0)
Alkaline Phosphatase: 90 U/L (ref 38–126)
Anion gap: 13 (ref 5–15)
BUN: 18 mg/dL (ref 6–20)
CO2: 25 mmol/L (ref 22–32)
Calcium: 8.6 mg/dL — ABNORMAL LOW (ref 8.9–10.3)
Chloride: 94 mmol/L — ABNORMAL LOW (ref 98–111)
Creatinine, Ser: 1.17 mg/dL (ref 0.61–1.24)
GFR calc Af Amer: >60 mL/min (ref >60)
GFR calc non Af Amer: >60 mL/min (ref >60)
Glucose, Bld: 99 mg/dL (ref 70–99)
Potassium: 3.9 mmol/L (ref 3.5–5.1)
Sodium: 132 mmol/L — ABNORMAL LOW (ref 135–145)
Total Bilirubin: 1.3 mg/dL — ABNORMAL HIGH (ref 0.3–1.2)
Total Protein: 7.5 g/dL (ref 6.5–8.1)

## 2020-04-07 LAB — CBC
HCT: 33 % — ABNORMAL LOW (ref 39.0–52.0)
Hemoglobin: 11.2 g/dL — ABNORMAL LOW (ref 13.0–17.0)
MCH: 30 pg (ref 26.0–34.0)
MCHC: 33.9 g/dL (ref 30.0–36.0)
MCV: 88.5 fL (ref 80.0–100.0)
Platelets: 367 10*3/uL (ref 150–400)
RBC: 3.73 MIL/uL — ABNORMAL LOW (ref 4.22–5.81)
RDW: 13 % (ref 11.5–15.5)
WBC: 12.7 10*3/uL — ABNORMAL HIGH (ref 4.0–10.5)
nRBC: 0 % (ref 0.0–0.2)

## 2020-04-07 MED ORDER — CLINDAMYCIN HCL 300 MG PO CAPS: 300.0000 mg | ORAL_CAPSULE | Freq: Three times a day (TID) | ORAL | 0 refills | Status: AC

## 2020-04-07 MED ORDER — CLINDAMYCIN HCL 150 MG PO CAPS
300.0000 mg | ORAL_CAPSULE | Freq: Once | ORAL | Status: AC
  Administered 2020-04-07: 300 mg via ORAL
  Filled 2020-04-07: qty 2

## 2020-04-07 NOTE — Discharge Instructions (Signed)
Please take antibiotics as prescribed for their entire course.  Return to the emergency department if you develop a fever, worsening pain or swelling of the left hand, or any other symptom personally concerning to yourself.

## 2020-04-07 NOTE — ED Notes (Signed)
ED Provider at bedside. 

## 2020-04-07 NOTE — ED Notes (Signed)
Left hand noted to be swollen and red. Tender to touch. Pt states he injects drug in this hand.

## 2020-04-07 NOTE — ED Triage Notes (Signed)
Patient arrived via [place] [employer] under arrest. Patient is under the influence of Methamphetamine and Heroin. Patient injects all drugs. Patient does admit to using cocaine as well but not within 24 hours just Heroin and Methamphetamine. Patient left hand is significantly swollen.

## 2020-04-07 NOTE — ED Provider Notes (Signed)
[facility] Emergency Department Provider Note  Time seen: 6:26 PM  I have reviewed the triage vital signs and the nursing notes.   HISTORY  Chief Complaint Police Custody, Medical Clearance, and Addiction Problem   HPI Brian Oliff. is a 24 y.o. male with a past medical history of substance abuse presents to the emergency department for medical clearance.  Patient brought in police custody for evaluation before being brought to jail.  Patient has a swollen and tender left hand.  Patient states his hand has been swollen over the past 2 days along with tenderness and feeling hot.  Concern for possible infection so the patient was brought to the emergency department.  Patient does admit active intoxication with methamphetamine.  When asked what happened today he states "I got fucked up on meth."  Patient's only complaint currently is feeling very thirsty and want something to drink.   Past Medical History:  Diagnosis Date  . Asthma   . Cocaine abuse (HCC)   . Crack cocaine use   . 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 Methamphetamine use (HCC) 04/07/2020    Patient Active Problem List   Diagnosis Date Noted  . Aggression 12/10/2019  . 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 Cocaine use   . Marijuana use     History reviewed. No pertinent surgical history.  Prior to Admission medications   Medication Sig Start Date End Date Taking? Authorizing Provider  mupirocin ointment (BACTROBAN) 2 % Apply to affected area 3 times daily 03/27/20 03/27/21  Nita Sickle, MD    Allergies  Allergen Reactions  . Bee Venom Anaphylaxis    No family history on file.  Social History Social History   Tobacco Use  . Smoking status: Current Every Day Smoker    Packs/day: 0.50    Types: Cigarettes  . Smokeless tobacco: Never Used  Vaping Use  . Vaping Use: Every day   Substance Use Topics  . Alcohol use: Yes    Comment: occ  . Drug use: Yes    Types: Marijuana, IV, Cocaine, Methamphetamines    Comment: heroin    Review of Systems Constitutional: Negative for fever. Cardiovascular: Negative for chest pain. Respiratory: Negative for shortness of breath. Gastrointestinal: Negative for abdominal pain Musculoskeletal: Left hand pain and swelling Skin: Mild redness around the left hand Neurological: Negative for headache All other ROS negative  ____________________________________________   PHYSICAL EXAM:  VITAL SIGNS: ED Triage Vitals  Enc Vitals Group     BP 04/07/20 1809 117/74     Pulse Rate 04/07/20 1809 (!) 119     Resp 04/07/20 1809 (!) 29     Temp 04/07/20 1809 98.9 F (37.2 C)     Temp Source 04/07/20 1809 Oral     SpO2 04/07/20 1809 96 %     Weight 04/07/20 1811 135 lb (61.2 kg)     Height 04/07/20 1811 6' (1.829 m)     Head Circumference --      Peak Flow --      Pain Score 04/07/20 1810 6     Pain Loc --      Pain Edu? --      Excl. in GC? --    Constitutional: Patient is awake alert, hyperactive acting Eyes: Normal exam ENT      Head: Normocephalic and atraumatic.      Mouth/Throat: Mucous membranes are moist. Cardiovascular: Normal rate,  regular rhythm.  Respiratory: Normal respiratory effort without tachypnea nor retractions. Breath sounds are clear  Gastrointestinal: Soft and nontender. No distention. Musculoskeletal: Left hand is swollen on the dorsal aspect along with moderate tenderness.  Patient is able to make a fist and extend without issue.  There are several track marks to the dorsal aspect of the hand as well. Neurologic:  Normal speech and language. No gross focal neurologic deficits  Skin: Mild erythema to dorsal aspect of left hand. Psychiatric: Hyperactive  ____________________________________________   INITIAL IMPRESSION / ASSESSMENT AND PLAN / ED COURSE  Pertinent labs & imaging results that  were available during my care of the patient were reviewed by me and considered in my medical decision making (see chart for details).   Patient presents emergency department for medical clearance to go to jail.  Patient has 2 days of swelling and pain to the dorsal aspect of the left hand.  Patient has track marks to the dorsal aspect left hand as well.  Suspect likely infection.  We will check labs, x-ray and continue to closely monitor.  Mild leukocytosis, x-ray shows swelling without foreign body.  We will place the patient on clindamycin 3 times daily we will start the patient on antibiotics today.  I discussed return precautions.  Patient has been cited by police but will be discharged home upon release from the emergency department.  Brian Decker. was evaluated in Emergency Department on 04/07/2020 for the symptoms described in the history of present illness. He was evaluated in the context of the global COVID-19 pandemic, which necessitated consideration that the patient might be at risk for infection with the SARS-CoV-2 virus that causes COVID-19. Institutional protocols and algorithms that pertain to the evaluation of patients at risk for COVID-19 are in a state of rapid change based on information released by regulatory bodies including the CDC and federal and state organizations. These policies and algorithms were followed during the patient's care in the ED.  ____________________________________________   FINAL CLINICAL IMPRESSION(S) / ED DIAGNOSES  Cellulitis   Minna Antis, MD 04/07/20 2020

## 2020-04-07 NOTE — ED Notes (Signed)
Reviewed discharge instructions, follow-up care, and prescriptions with patient. Patient verbalized understanding of all information reviewed. Patient stable, with no distress noted at this time.    

## 2020-04-07 NOTE — ED Notes (Signed)
Patient given a snack and water.

## 2020-05-06 ENCOUNTER — Emergency Department: Admission: EM | Admit: 2020-05-06 | Discharge: 2020-05-07 | Discharge status: Against Medical Advice | Payer: Self-pay

## 2020-05-06 NOTE — ED Notes (Signed)
Patient admits to heroin use but at the time of triage states "we aren't gonna help him because he is on drugs." Patient encouraged to stay however, left.

## 2020-05-06 NOTE — ED Notes (Addendum)
First Nurse Note: Patient is unable to sit still. Is in constant motion stating that his back is "killing him." Patient without smooth tracking of muscles at this time. Denies illicit drug use

## 2020-06-16 ENCOUNTER — Other Ambulatory Visit: Payer: Self-pay

## 2020-06-16 ENCOUNTER — Emergency Department
Admission: EM | Admit: 2020-06-16 | Discharge: 2020-06-16 | Disposition: A | Discharge status: Home / Self Care | Payer: Self-pay | Attending: Emergency Medicine | Admitting: Emergency Medicine

## 2020-06-16 DIAGNOSIS — J45909 Unspecified asthma, uncomplicated: Secondary | ICD-10-CM | POA: Insufficient documentation

## 2020-06-16 DIAGNOSIS — T401X1A Poisoning by heroin, accidental (unintentional), initial encounter: Secondary | ICD-10-CM | POA: Insufficient documentation

## 2020-06-16 DIAGNOSIS — F17210 Nicotine dependence, cigarettes, uncomplicated: Secondary | ICD-10-CM | POA: Insufficient documentation

## 2020-06-16 MED ORDER — NALOXONE HCL 4 MG/0.1ML NA LIQD
1.0000 | Freq: Once | NASAL | Status: AC
  Administered 2020-06-16: 1 via NASAL
  Filled 2020-06-16: qty 4

## 2020-06-16 NOTE — ED Triage Notes (Signed)
EMS called out for heroin OD. EMS reports 0.4 narcan IM by pt friend. A&O on EMS arrival

## 2020-06-16 NOTE — ED Provider Notes (Signed)
[facility] Emergency Department Provider Note   ____________________________________________    I have reviewed the triage vital signs and the nursing notes.   HISTORY  Chief Complaint Drug Overdose     HPI Brian Decker. is a 24 y.o. male with a history of substance abuse who presents after accidental heroin overdose.  EMS reports the patient's friends gave him Narcan and he has been doing well in route to the hospital breathing without assistance.  No additional Narcan given.  Patient has no complaints at this time.  He reports he was not trying to harm himself but instead trying to "get high "  Past Medical History:  Diagnosis Date  . Asthma   . Cocaine abuse (HCC)   . Crack cocaine use   . 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 Methamphetamine use (HCC) 04/07/2020    Patient Active Problem List   Diagnosis Date Noted  . Aggression 12/10/2019  . 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 Cocaine use   . Marijuana use     No past surgical history on file.  Prior to Admission medications   Medication Sig Start Date End Date Taking? Authorizing Provider  mupirocin ointment (BACTROBAN) 2 % Apply to affected area 3 times daily 03/27/20 03/27/21  Nita Sickle, MD     Allergies Bee venom  No family history on file.  Social History Social History   Tobacco Use  . Smoking status: Current Every Day Smoker    Packs/day: 0.50    Types: Cigarettes  . Smokeless tobacco: Never Used  Vaping Use  . Vaping Use: Every day  Substance Use Topics  . Alcohol use: Yes    Comment: occ  . Drug use: Yes    Types: Marijuana, IV, Cocaine, Methamphetamines    Comment: heroin    Review of Systems  Constitutional: No fever/chills Eyes: No visual changes.  ENT: No sore throat. Cardiovascular: Denies chest pain. Respiratory: Denies shortness of  breath. Gastrointestinal: No abdominal pain.   Genitourinary: Negative for dysuria. Musculoskeletal: Negative for back pain. Skin: Negative for rash. Neurological: Negative for headache   ____________________________________________   PHYSICAL EXAM:  VITAL SIGNS: ED Triage Vitals [06/16/20 1255]  Enc Vitals Group     BP 114/63     Pulse Rate 85     Resp (!) 9     Temp 98.3 F (36.8 C)     Temp Source Oral     SpO2 96 %     Weight 74.8 kg (165 lb)     Height 1.829 m (6')     Head Circumference      Peak Flow      Pain Score 0     Pain Loc      Pain Edu?      Excl. in GC?     Constitutional: Alert and oriented.   Nose: No congestion/rhinnorhea. Mouth/Throat: Mucous membranes are moist.    Cardiovascular: Normal rate, regular rhythm. Grossly normal heart sounds.  Good peripheral circulation. Respiratory: Normal respiratory effort.  No retractions. Lungs CTAB. Gastrointestinal: Soft and nontender. No distention.  No CVA tenderness.  Musculoskeletal: No lower extremity tenderness nor edema.  Warm and well perfused Neurologic:  Normal speech and language. No gross focal neurologic deficits are appreciated.  Skin:  Skin is warm, dry and intact. No rash noted. Psychiatric: Mood and affect are normal. Speech and behavior are normal.  ____________________________________________  LABS (all labs ordered are listed, but only abnormal results are displayed)  Labs Reviewed - No data to display ____________________________________________  EKG  None ____________________________________________  RADIOLOGY  None ____________________________________________   PROCEDURES  Procedure(s) performed: yes  .1-3 Lead EKG Interpretation Performed by: Jene Every, MD Authorized by: Jene Every, MD     Interpretation: normal     ECG rate assessment: normal     Rhythm: sinus rhythm     Ectopy: none     Conduction: normal       Critical Care performed:  No ____________________________________________   INITIAL IMPRESSION / ASSESSMENT AND PLAN / ED COURSE  Pertinent labs & imaging results that were available during my care of the patient were reviewed by me and considered in my medical decision making (see chart for details).  Patient presents after accidental heroin overdose.  Was given Narcan by his friends, in no acute distress at this time.  Will place on the cardiac monitor and observe here in the emergency department.  ----------------------------------------- 2:58 PM on 06/16/2020 -----------------------------------------  Patient with no decreased respiratory rate or hypoxia during observation, he is hungry, we will provide food and he will be discharged with Narcan nasal spray emergency backup.    ____________________________________________   FINAL CLINICAL IMPRESSION(S) / ED DIAGNOSES  Final diagnoses:  Accidental overdose of heroin, initial encounter [facility])        Note:  This document was prepared using Dragon voice recognition software and may include unintentional dictation errors.   Jene Every, MD 06/16/20 [phone]

## 2020-08-14 ENCOUNTER — Emergency Department
Admission: EM | Admit: 2020-08-14 | Discharge: 2020-08-15 | Disposition: A | Discharge status: Home / Self Care | Payer: Self-pay | Attending: Emergency Medicine | Admitting: Emergency Medicine

## 2020-08-14 ENCOUNTER — Encounter: Payer: Self-pay | Admitting: Emergency Medicine

## 2020-08-14 DIAGNOSIS — J45909 Unspecified asthma, uncomplicated: Secondary | ICD-10-CM | POA: Insufficient documentation

## 2020-08-14 DIAGNOSIS — F1910 Other psychoactive substance abuse, uncomplicated: Secondary | ICD-10-CM

## 2020-08-14 DIAGNOSIS — Z20822 Contact with and (suspected) exposure to covid-19: Secondary | ICD-10-CM | POA: Insufficient documentation

## 2020-08-14 DIAGNOSIS — F1510 Other stimulant abuse, uncomplicated: Secondary | ICD-10-CM | POA: Insufficient documentation

## 2020-08-14 DIAGNOSIS — F17210 Nicotine dependence, cigarettes, uncomplicated: Secondary | ICD-10-CM | POA: Insufficient documentation

## 2020-08-14 DIAGNOSIS — M6282 Rhabdomyolysis: Secondary | ICD-10-CM

## 2020-08-14 LAB — CBC WITH DIFFERENTIAL/PLATELET
Abs Immature Granulocytes: 0.12 10*3/uL — ABNORMAL HIGH (ref 0.00–0.07)
Basophils Absolute: 0.1 10*3/uL (ref 0.0–0.1)
Basophils Relative: 0 %
Eosinophils Absolute: 0.7 10*3/uL — ABNORMAL HIGH (ref 0.0–0.5)
Eosinophils Relative: 3 %
HCT: 38.1 % — ABNORMAL LOW (ref 39.0–52.0)
Hemoglobin: 13.1 g/dL (ref 13.0–17.0)
Immature Granulocytes: 1 %
Lymphocytes Relative: 21 %
Lymphs Abs: 5 10*3/uL — ABNORMAL HIGH (ref 0.7–4.0)
MCH: 29.9 pg (ref 26.0–34.0)
MCHC: 34.4 g/dL (ref 30.0–36.0)
MCV: 87 fL (ref 80.0–100.0)
Monocytes Absolute: 1.9 10*3/uL — ABNORMAL HIGH (ref 0.1–1.0)
Monocytes Relative: 8 %
Neutro Abs: 16.3 10*3/uL — ABNORMAL HIGH (ref 1.7–7.7)
Neutrophils Relative %: 67 %
Platelets: 309 10*3/uL (ref 150–400)
RBC: 4.38 MIL/uL (ref 4.22–5.81)
RDW: 12.9 % (ref 11.5–15.5)
Smear Review: NORMAL
WBC: 24.1 10*3/uL — ABNORMAL HIGH (ref 4.0–10.5)
nRBC: 0 % (ref 0.0–0.2)

## 2020-08-14 LAB — COMPREHENSIVE METABOLIC PANEL
ALT: 50 U/L — ABNORMAL HIGH (ref 0–44)
AST: 46 U/L — ABNORMAL HIGH (ref 15–41)
Albumin: 5.4 g/dL — ABNORMAL HIGH (ref 3.5–5.0)
Alkaline Phosphatase: 74 U/L (ref 38–126)
Anion gap: 17 — ABNORMAL HIGH (ref 5–15)
BUN: 15 mg/dL (ref 6–20)
CO2: 27 mmol/L (ref 22–32)
Calcium: 9.2 mg/dL (ref 8.9–10.3)
Chloride: 97 mmol/L — ABNORMAL LOW (ref 98–111)
Creatinine, Ser: 1.55 mg/dL — ABNORMAL HIGH (ref 0.61–1.24)
GFR, Estimated: >60 mL/min (ref >60)
Glucose, Bld: 56 mg/dL — ABNORMAL LOW (ref 70–99)
Potassium: 3.5 mmol/L (ref 3.5–5.1)
Sodium: 141 mmol/L (ref 135–145)
Total Bilirubin: 2.5 mg/dL — ABNORMAL HIGH (ref 0.3–1.2)
Total Protein: 8.7 g/dL — ABNORMAL HIGH (ref 6.5–8.1)

## 2020-08-14 LAB — CK: Total CK: 528 U/L — ABNORMAL HIGH (ref 49–397)

## 2020-08-14 LAB — CBG MONITORING, ED: Glucose-Capillary: 66 mg/dL — ABNORMAL LOW (ref 70–99)

## 2020-08-14 LAB — ETHANOL: Alcohol, Ethyl (B): <10 mg/dL (ref <10)

## 2020-08-14 LAB — ACETAMINOPHEN LEVEL: Acetaminophen (Tylenol), Serum: <10 ug/mL — ABNORMAL LOW (ref 10–30)

## 2020-08-14 LAB — SALICYLATE LEVEL: Salicylate Lvl: <7 mg/dL — ABNORMAL LOW (ref 7.0–30.0)

## 2020-08-14 LAB — POC SARS CORONAVIRUS 2 AG -  ED: SARS Coronavirus 2 Ag: NEGATIVE

## 2020-08-14 MED ORDER — LORAZEPAM 2 MG/ML IJ SOLN
1.0000 mg | Freq: Once | INTRAMUSCULAR | Status: AC
  Administered 2020-08-14: 1 mg via INTRAVENOUS
  Filled 2020-08-14: qty 1

## 2020-08-14 MED ORDER — MIDAZOLAM HCL 2 MG/2ML IJ SOLN
INTRAMUSCULAR | Status: AC
  Filled 2020-08-14: qty 6

## 2020-08-14 MED ORDER — MIDAZOLAM HCL 2 MG/2ML IJ SOLN
5.0000 mg | Freq: Once | INTRAMUSCULAR | Status: AC
  Administered 2020-08-14: 5 mg via INTRAVENOUS

## 2020-08-14 MED ORDER — LACTATED RINGERS IV BOLUS
1000.0000 mL | Freq: Once | INTRAVENOUS | Status: AC
  Administered 2020-08-14: 1000 mL via INTRAVENOUS

## 2020-08-14 MED ORDER — LORAZEPAM 2 MG/ML IJ SOLN
1.0000 mg | Freq: Once | INTRAMUSCULAR | Status: AC
  Administered 2020-08-14: 1 mg via INTRAVENOUS

## 2020-08-14 MED ORDER — NALOXONE HCL 2 MG/2ML IJ SOSY
PREFILLED_SYRINGE | INTRAMUSCULAR | Status: AC
  Filled 2020-08-14: qty 2

## 2020-08-14 NOTE — ED Notes (Signed)
Pt extremely agitated. Dr Fanny Bien at bedside, ordered 2nd dose of ativan IV.

## 2020-08-14 NOTE — ED Notes (Signed)
Patient's O2 dropped into 80's. Patient placed on 2L. Fanny Bien, MD aware.

## 2020-08-14 NOTE — ED Triage Notes (Signed)
Patient arrives in police custody for medical clearance for jail. Patient states he did meth and heroin last yesterday. Patient is is awake but uncooperative at this time.

## 2020-08-14 NOTE — ED Notes (Signed)
Pt is thrashing around in bed kicking in the air and kicking the guard rails. Pt is continuing to roll around in bed at this time

## 2020-08-14 NOTE — ED Notes (Signed)
Patient's belongings searched by PD prior to arrival.

## 2020-08-14 NOTE — ED Notes (Signed)
Narcan at bedside incase needed at a later time.

## 2020-08-14 NOTE — ED Provider Notes (Signed)
[facility] Emergency Department Provider Note   ____________________________________________   None    (approximate)  I have reviewed the triage vital signs and the nursing notes.   HISTORY  Chief Complaint Medical Clearance  EM caveat: Patient agitated, with choreoathetoid-like activity  HPI Brian Decker. is a 25 y.o. male history of polysubstance abuse and asthma  Patient presents today in custody of the police.  Police officer tells me that they were called her disturbance at local hotel, patient was involved in this and they found that he had warrants leading to his arrest.  He is currently under arrest  The patient reports that he is currently high and under the influence of methamphetamine.  He reports he is very fidgety and tweaking due to this.  He just used methamphetamine.  Patient does not report any suicidal or self harming thoughts.  He does report a long history of polysubstance abuse including methamphetamine   No known or reported trauma.  Past Medical History:  Diagnosis Date  . Asthma   . Cocaine abuse (HCC)   . Crack cocaine use   . 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 Methamphetamine use (HCC) 04/07/2020    Patient Active Problem List   Diagnosis Date Noted  . Aggression 12/10/2019  . 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 Cocaine use   . Marijuana use     History reviewed. No pertinent surgical history.  Prior to Admission medications   Medication Sig Start Date End Date Taking? Authorizing Provider  mupirocin ointment (BACTROBAN) 2 % Apply to affected area 3 times daily 03/27/20 03/27/21  Nita Sickle, MD    Allergies Bee venom  No family history on file.  Social History Social History   Tobacco Use  . Smoking status: Current Every Day Smoker    Packs/day: 0.50    Types: Cigarettes  . Smokeless tobacco:  Never Used  Vaping Use  . Vaping Use: Every day  Substance Use Topics  . Alcohol use: Yes    Comment: occ  . Drug use: Yes    Types: Marijuana, IV, Cocaine, Methamphetamines    Comment: heroin    Review of Systems  EM caveat.  Patient reports he is high.   ____________________________________________   PHYSICAL EXAM:  VITAL SIGNS: ED Triage Vitals  Enc Vitals Group     BP 08/14/20 2142 (!) 163/127     Pulse Rate 08/14/20 2142 (!) 120     Resp 08/14/20 2142 (!) 22     Temp 08/14/20 2142 98.3 F (36.8 C)     Temp Source 08/14/20 2142 Oral     SpO2 08/14/20 2142 94 %     Weight 08/14/20 2144 180 lb (81.6 kg)     Height 08/14/20 2144 6' (1.829 m)     Head Circumference --      Peak Flow --      Pain Score 08/14/20 2143 0     Pain Loc --      Pain Edu? --      Excl. in GC? --     Constitutional: Alert and oriented to self year and date.  He is diaphoretic, moving about the bed cannot sit still, lifting and lowering legs, showing chorea like movement in all extremities eyes: Conjunctivae are normal.  Pupils are slightly dilated and reactive Head: Atraumatic. Nose: No congestion/rhinnorhea. Mouth/Throat: Mucous membranes are dry. Neck: No stridor.  Cardiovascular:  Laterally tachycardic rate, regular rhythm. Grossly normal heart sounds.  Good peripheral circulation. Respiratory: Mild tachypnea, speaks with pressured speech.  No respiratory distress though. Gastrointestinal: Soft and nontender. No distention. Musculoskeletal: No lower extremity tenderness nor edema. Neurologic: Elevated mood and speech.  No gross focal neurologic deficits are appreciated.  Skin:  Skin is warm, diaphoretic and intact. No rash noted. Psychiatric: Mood and affect are elevated, agitated, cannot hold himself still, kicking about the bed.  In forensic restraints by [employer].  Patient is laying on his back comfortably without respiratory distress and restraints do not fear to  impede his chest, namely he is handcuffed to the stretcher the arms at his side  ____________________________________________   LABS (all labs ordered are listed, but only abnormal results are displayed)  Labs Reviewed  COMPREHENSIVE METABOLIC PANEL - Abnormal; Notable for the following components:      Result Value   Chloride 97 (*)    Glucose, Bld 56 (*)    Creatinine, Ser 1.55 (*)    Total Protein 8.7 (*)    Albumin 5.4 (*)    AST 46 (*)    ALT 50 (*)    Total Bilirubin 2.5 (*)    Anion gap 17 (*)    All other components within normal limits  CBC WITH DIFFERENTIAL/PLATELET - Abnormal; Notable for the following components:   WBC 24.1 (*)    HCT 38.1 (*)    Neutro Abs 16.3 (*)    Lymphs Abs 5.0 (*)    Monocytes Absolute 1.9 (*)    Eosinophils Absolute 0.7 (*)    Abs Immature Granulocytes 0.12 (*)    All other components within normal limits  CK - Abnormal; Notable for the following components:   Total CK 528 (*)    All other components within normal limits  ACETAMINOPHEN LEVEL - Abnormal; Notable for the following components:   Acetaminophen (Tylenol), Serum <10 (*)    All other components within normal limits  SALICYLATE LEVEL - Abnormal; Notable for the following components:   Salicylate Lvl <7.0 (*)    All other components within normal limits  CBG MONITORING, ED - Abnormal; Notable for the following components:   Glucose-Capillary 66 (*)    All other components within normal limits  ETHANOL  URINE DRUG SCREEN, QUALITATIVE ([facility] ONLY)  COMPREHENSIVE METABOLIC PANEL  CK  POC SARS CORONAVIRUS 2 AG -  ED   ____________________________________________  EKG   ____________________________________________  RADIOLOGY  No noted indication for imaging at this time.  Patient self reports use of methamphetamine and reports that he is acutely high and under the influence of amphetamine.  Additionally, he denies any acute neurologic symptoms and is alert and fully  oriented.  There is no report of trauma no evidence of trauma by exam ____________________________________________   PROCEDURES  Procedure(s) performed: None  Procedures  Critical Care performed: Yes, see critical care note(s)  CRITICAL CARE Performed by: Sharyn Creamer   Total critical care time: 25 minutes  Critical care time was exclusive of separately billable procedures and treating other patients.  Critical care was necessary to treat or prevent imminent or life-threatening deterioration.  Critical care was time spent personally by me on the following activities: development of treatment plan with patient and/or surrogate as well as nursing, discussions with consultants, evaluation of patient's response to treatment, examination of patient, obtaining history from patient or surrogate, ordering and performing treatments and interventions, ordering and review of laboratory studies, ordering and review  of radiographic studies, pulse oximetry and re-evaluation of patient's condition.  Patient presents with diaphoresis, acute tachycardia, elevation of mood, and appears to be under the effect of likely acute sympathomimetic toxidrome likely due to methamphetamine.  He required IV benzodiazepine for calming and sedation due to his elevated state ____________________________________________   INITIAL IMPRESSION / ASSESSMENT AND PLAN / ED COURSE  Pertinent labs & imaging results that were available during my care of the patient were reviewed by me and considered in my medical decision making (see chart for details).   Patient seen evaluated, on presentation and is showing evidence of what appears to be acute sympathomimetic like toxidrome.  He is highly agitated, diaphoretic tachycardic with choreiform movements.  He is under custody of the police department as well.  The clinical history as well as review of his clinical records indicate likely suffering from acute methamphetamine abuse or  other polysubstance abuse.  He does not have acute psychiatric symptoms such as hallucinations or suicidal thoughts.  Clinical Course as of 08/14/20 2313  Tue Aug 14, 2020  2153 Patient is calm now, heart rate is normalized, respiratory rate 18-20, he is breathing with adequate respirations at this time.  Of note, his pupils are now constricted, and I will have naloxone at the bedside in the case of need.  I suspect there may be elements of polysubstance abuse here.  He is much calmer heart rate improving, and he is resting comfortably at this time after receiving midazolam.  On full cardiac monitoring and pulse ox. [MQ]  2229 Historically patient is noted to have a very mild transaminitis.  He does have slight increase in his bilirubin today.  Additionally has a mild elevated anion gap which I suspect is due to his elevated CK level consistent with mild rhabdo myelolysis.  I will provide additional fluids, and we will plan to repeat a chemistry as well as hepatic panel after fluids approximately 003 0 hours anticipated. [MQ]    Clinical Course User Index [MQ] Sharyn Creamer, MD   ----------------------------------------- 10:12 PM on 08/14/2020 -----------------------------------------  Patient with notable leukocytosis on his labs, I favor this is likely reactive in nature.  He is afebrile, he did not report any obvious infectious symptoms.  He is responded well to treatment here and clearly made report of amphetamine use just prior to arrival.  He has no acute respiratory symptoms.  Will check Covid test.  Continue to monitor closely.  Await further labs  Ongoing care assigned to Dr. Deneen Harts.  At this point patient is starting to wake up, alert, and will be continue to be observed medically for concerns of toxidrome likely related to amphetamine abuse.  Follow-up repeat chemistry and CK after fluids. ____________________________________________   FINAL CLINICAL IMPRESSION(S) / ED  DIAGNOSES  Final diagnoses:  Episodic methamphetamine abuse (HCC)        Note:  This document was prepared using Dragon voice recognition software and may include unintentional dictation errors       Sharyn Creamer, MD 08/14/20 2313

## 2020-08-14 NOTE — ED Notes (Signed)
Pt is resting quietly at this time.

## 2020-08-15 LAB — COMPREHENSIVE METABOLIC PANEL
ALT: 42 U/L (ref 0–44)
AST: 41 U/L (ref 15–41)
Albumin: 4.4 g/dL (ref 3.5–5.0)
Alkaline Phosphatase: 65 U/L (ref 38–126)
Anion gap: 14 (ref 5–15)
BUN: 15 mg/dL (ref 6–20)
CO2: 21 mmol/L — ABNORMAL LOW (ref 22–32)
Calcium: 8.8 mg/dL — ABNORMAL LOW (ref 8.9–10.3)
Chloride: 99 mmol/L (ref 98–111)
Creatinine, Ser: 1.19 mg/dL (ref 0.61–1.24)
GFR, Estimated: >60 mL/min (ref >60)
Glucose, Bld: 85 mg/dL (ref 70–99)
Potassium: 4.9 mmol/L (ref 3.5–5.1)
Sodium: 134 mmol/L — ABNORMAL LOW (ref 135–145)
Total Bilirubin: 2.8 mg/dL — ABNORMAL HIGH (ref 0.3–1.2)
Total Protein: 7 g/dL (ref 6.5–8.1)

## 2020-08-15 LAB — BASIC METABOLIC PANEL
Anion gap: 13 (ref 5–15)
BUN: 14 mg/dL (ref 6–20)
CO2: 23 mmol/L (ref 22–32)
Calcium: 8.8 mg/dL — ABNORMAL LOW (ref 8.9–10.3)
Chloride: 101 mmol/L (ref 98–111)
Creatinine, Ser: 1.17 mg/dL (ref 0.61–1.24)
GFR, Estimated: >60 mL/min (ref >60)
Glucose, Bld: 78 mg/dL (ref 70–99)
Potassium: 4.2 mmol/L (ref 3.5–5.1)
Sodium: 137 mmol/L (ref 135–145)

## 2020-08-15 LAB — URINE DRUG SCREEN, QUALITATIVE (ARMC ONLY)
Amphetamines, Ur Screen: POSITIVE — AB
Barbiturates, Ur Screen: NOT DETECTED
Benzodiazepine, Ur Scrn: POSITIVE — AB
Cannabinoid 50 Ng, Ur ~~LOC~~: POSITIVE — AB
Cocaine Metabolite,Ur ~~LOC~~: NOT DETECTED
MDMA (Ecstasy)Ur Screen: NOT DETECTED
Methadone Scn, Ur: NOT DETECTED
Opiate, Ur Screen: NOT DETECTED
Phencyclidine (PCP) Ur S: NOT DETECTED
Tricyclic, Ur Screen: NOT DETECTED

## 2020-08-15 LAB — CBG MONITORING, ED: Glucose-Capillary: 93 mg/dL (ref 70–99)

## 2020-08-15 LAB — CK
Total CK: 546 U/L — ABNORMAL HIGH (ref 49–397)
Total CK: 530 U/L — ABNORMAL HIGH (ref 49–397)

## 2020-08-15 MED ORDER — KCL-LACTATED RINGERS-D5W 20 MEQ/L IV SOLN
INTRAVENOUS | Status: DC
  Filled 2020-08-15 (×14): qty 1000

## 2020-08-15 MED ORDER — LACTATED RINGERS IV BOLUS
2000.0000 mL | Freq: Once | INTRAVENOUS | Status: AC
  Administered 2020-08-15: 2000 mL via INTRAVENOUS

## 2020-08-15 MED ORDER — ONDANSETRON 4 MG PO TBDP
4.0000 mg | ORAL_TABLET | Freq: Once | ORAL | Status: DC
  Filled 2020-08-15: qty 1

## 2020-08-15 MED ORDER — MIDAZOLAM HCL 2 MG/2ML IJ SOLN
2.0000 mg | Freq: Once | INTRAMUSCULAR | Status: AC
  Administered 2020-08-15: 2 mg via INTRAVENOUS
  Filled 2020-08-15: qty 2

## 2020-08-15 NOTE — ED Provider Notes (Addendum)
Patient received in signout from Dr. Fanny Bien pending clinical sobriety and resolution of mild metabolic derangements. Briefly, patient is a history of polysubstance abuse and presented to the ED in a sympathomimetic toxidrome, consistent with methamphetamine intoxication. Patient required multiple doses of anxiolysis to facilitate work-up and treatment. Multiple rechecks of metabolic panel and CK demonstrate improvement with IV rehydration. Patient obtains clinical sobriety and tolerates p.o. intake. No evidence of psychiatric emergency. He will be discharged into police custody.  Clinical Course as of 08/15/20 0432  Tue Aug 14, 2020  2153 Patient is calm now, heart rate is normalized, respiratory rate 18-20, he is breathing with adequate respirations at this time.  Of note, his pupils are now constricted, and I will have naloxone at the bedside in the case of need.  I suspect there may be elements of polysubstance abuse here.  He is much calmer heart rate improving, and he is resting comfortably at this time after receiving midazolam.  On full cardiac monitoring and pulse ox. [MQ]  2229 Historically patient is noted to have a very mild transaminitis.  He does have slight increase in his bilirubin today.  Additionally has a mild elevated anion gap which I suspect is due to his elevated CK level consistent with mild rhabdo myelolysis.  I will provide additional fluids, and we will plan to repeat a chemistry as well as hepatic panel after fluids approximately 003 0 hours anticipated. [MQ]  Wed Aug 15, 2020  0135 Continued agitation necessitating repeat dosing of Versed.  Recheck of his CBG demonstrates glucose of 93.  Recheck of CK and CMP, at the direction of previous provider, indicates marginal worsening of the CK and new decrease in his bicarbonate.  Resolution of anion gap.  We will provide additional IV fluid rehydration [DS]  0337 Call from nurse that patient is awake, alert and much more cogent. We will  recheck blood work 1 more time [DS]  0428 Reassessed. Patient alert and oriented to person, place, location and time. Moved to a hallway bed. Patient reports relapsing on methamphetamines and heroin last night. Denies suicidal intent and indicates that he has no desire for rehab. He is currently under police custody and will be discharged into their custody due to multiple arrest warrants. We discussed continued and slight elevation of CK and we discussed increasing p.o. intake of liquids [DS]    Clinical Course User Index [DS] Delton Prairie, MD [MQ] Sharyn Creamer, MD      Delton Prairie, MD 08/15/20 6629    Delton Prairie, MD 08/15/20 [phone]

## 2020-08-15 NOTE — Discharge Instructions (Signed)
Drink plenty of fluids

## 2020-08-15 NOTE — ED Notes (Signed)
Pt thrashing in bed and rolling around during blood draw. MD notified

## 2020-08-15 NOTE — ED Notes (Signed)
Pt discharged to police custody at this time

## 2021-01-04 ENCOUNTER — Encounter: Payer: Self-pay | Admitting: Emergency Medicine

## 2021-01-04 ENCOUNTER — Inpatient Hospital Stay
Admission: EM | Admit: 2021-01-04 | Discharge: 2021-01-06 | DRG: 641 | Disposition: A | Discharge status: Home / Self Care | Payer: Self-pay | Attending: Internal Medicine | Admitting: Internal Medicine

## 2021-01-04 ENCOUNTER — Other Ambulatory Visit: Payer: Self-pay

## 2021-01-04 DIAGNOSIS — F1210 Cannabis abuse, uncomplicated: Secondary | ICD-10-CM | POA: Diagnosis present

## 2021-01-04 DIAGNOSIS — D72829 Elevated white blood cell count, unspecified: Secondary | ICD-10-CM | POA: Diagnosis present

## 2021-01-04 DIAGNOSIS — E861 Hypovolemia: Secondary | ICD-10-CM | POA: Diagnosis present

## 2021-01-04 DIAGNOSIS — Z20822 Contact with and (suspected) exposure to covid-19: Secondary | ICD-10-CM | POA: Diagnosis present

## 2021-01-04 DIAGNOSIS — F1490 Cocaine use, unspecified, uncomplicated: Secondary | ICD-10-CM | POA: Diagnosis present

## 2021-01-04 DIAGNOSIS — N179 Acute kidney failure, unspecified: Secondary | ICD-10-CM | POA: Diagnosis present

## 2021-01-04 DIAGNOSIS — F1910 Other psychoactive substance abuse, uncomplicated: Secondary | ICD-10-CM

## 2021-01-04 DIAGNOSIS — R197 Diarrhea, unspecified: Secondary | ICD-10-CM

## 2021-01-04 DIAGNOSIS — Z5900 Homelessness unspecified: Secondary | ICD-10-CM

## 2021-01-04 DIAGNOSIS — F17210 Nicotine dependence, cigarettes, uncomplicated: Secondary | ICD-10-CM | POA: Diagnosis present

## 2021-01-04 DIAGNOSIS — Z91030 Bee allergy status: Secondary | ICD-10-CM

## 2021-01-04 DIAGNOSIS — F1290 Cannabis use, unspecified, uncomplicated: Secondary | ICD-10-CM | POA: Diagnosis present

## 2021-01-04 DIAGNOSIS — F1410 Cocaine abuse, uncomplicated: Secondary | ICD-10-CM | POA: Diagnosis present

## 2021-01-04 DIAGNOSIS — R531 Weakness: Secondary | ICD-10-CM

## 2021-01-04 DIAGNOSIS — Z79899 Other long term (current) drug therapy: Secondary | ICD-10-CM

## 2021-01-04 DIAGNOSIS — J45909 Unspecified asthma, uncomplicated: Secondary | ICD-10-CM | POA: Diagnosis present

## 2021-01-04 DIAGNOSIS — E860 Dehydration: Secondary | ICD-10-CM | POA: Diagnosis present

## 2021-01-04 DIAGNOSIS — E871 Hypo-osmolality and hyponatremia: Principal | ICD-10-CM | POA: Diagnosis present

## 2021-01-04 LAB — RESP PANEL BY RT-PCR (FLU A&B, COVID) ARPGX2
Influenza A by PCR: NEGATIVE
Influenza B by PCR: NEGATIVE
SARS Coronavirus 2 by RT PCR: NEGATIVE

## 2021-01-04 LAB — URINALYSIS, COMPLETE (UACMP) WITH MICROSCOPIC
Bacteria, UA: NONE SEEN
Bilirubin Urine: NEGATIVE
Glucose, UA: NEGATIVE mg/dL
Hgb urine dipstick: NEGATIVE
Ketones, ur: 20 mg/dL — AB
Leukocytes,Ua: NEGATIVE
Nitrite: NEGATIVE
Protein, ur: NEGATIVE mg/dL
Specific Gravity, Urine: 1.019 (ref 1.005–1.030)
Squamous Epithelial / HPF: NONE SEEN (ref 0–5)
pH: 5 (ref 5.0–8.0)

## 2021-01-04 LAB — CK: Total CK: 285 U/L (ref 49–397)

## 2021-01-04 LAB — CBC
HCT: 45.4 % (ref 39.0–52.0)
Hemoglobin: 16.7 g/dL (ref 13.0–17.0)
MCH: 30.3 pg (ref 26.0–34.0)
MCHC: 36.8 g/dL — ABNORMAL HIGH (ref 30.0–36.0)
MCV: 82.2 fL (ref 80.0–100.0)
Platelets: 367 10*3/uL (ref 150–400)
RBC: 5.52 MIL/uL (ref 4.22–5.81)
RDW: 12.5 % (ref 11.5–15.5)
WBC: 14 10*3/uL — ABNORMAL HIGH (ref 4.0–10.5)
nRBC: 0 % (ref 0.0–0.2)

## 2021-01-04 LAB — BASIC METABOLIC PANEL
Anion gap: 18 — ABNORMAL HIGH (ref 5–15)
BUN: 33 mg/dL — ABNORMAL HIGH (ref 6–20)
CO2: 27 mmol/L (ref 22–32)
Calcium: 9.8 mg/dL (ref 8.9–10.3)
Chloride: 76 mmol/L — ABNORMAL LOW (ref 98–111)
Creatinine, Ser: 1.41 mg/dL — ABNORMAL HIGH (ref 0.61–1.24)
GFR, Estimated: >60 mL/min (ref >60)
Glucose, Bld: 107 mg/dL — ABNORMAL HIGH (ref 70–99)
Potassium: 3.6 mmol/L (ref 3.5–5.1)
Sodium: 121 mmol/L — ABNORMAL LOW (ref 135–145)

## 2021-01-04 LAB — NA AND K (SODIUM & POTASSIUM), RAND UR
Potassium Urine: 35 mmol/L
Sodium, Ur: <10 mmol/L

## 2021-01-04 LAB — URINE DRUG SCREEN, QUALITATIVE (ARMC ONLY)
Amphetamines, Ur Screen: NOT DETECTED
Barbiturates, Ur Screen: NOT DETECTED
Benzodiazepine, Ur Scrn: NOT DETECTED
Cannabinoid 50 Ng, Ur ~~LOC~~: POSITIVE — AB
Cocaine Metabolite,Ur ~~LOC~~: NOT DETECTED
MDMA (Ecstasy)Ur Screen: NOT DETECTED
Methadone Scn, Ur: NOT DETECTED
Opiate, Ur Screen: NOT DETECTED
Phencyclidine (PCP) Ur S: NOT DETECTED
Tricyclic, Ur Screen: NOT DETECTED

## 2021-01-04 LAB — TSH: TSH: 0.182 u[IU]/mL — ABNORMAL LOW (ref 0.350–4.500)

## 2021-01-04 MED ORDER — ACETAMINOPHEN 325 MG PO TABS: 650.0000 mg | ORAL_TABLET | Freq: Four times a day (QID) | ORAL | Status: DC | PRN

## 2021-01-04 MED ORDER — TRAZODONE HCL 50 MG PO TABS
25.0000 mg | ORAL_TABLET | Freq: Every evening | ORAL | Status: DC | PRN
  Administered 2021-01-04: 25 mg via ORAL
  Filled 2021-01-04: qty 1

## 2021-01-04 MED ORDER — ENOXAPARIN SODIUM 40 MG/0.4ML IJ SOSY: 40.0000 mg | PREFILLED_SYRINGE | INTRAMUSCULAR | Status: DC

## 2021-01-04 MED ORDER — MAGNESIUM HYDROXIDE 400 MG/5ML PO SUSP
30.0000 mL | Freq: Every day | ORAL | Status: DC | PRN
  Filled 2021-01-04: qty 30

## 2021-01-04 MED ORDER — SODIUM CHLORIDE 0.9 % IV BOLUS
1000.0000 mL | Freq: Once | INTRAVENOUS | Status: AC
  Administered 2021-01-04: 21:00:00 1000 mL via INTRAVENOUS

## 2021-01-04 MED ORDER — ONDANSETRON HCL 4 MG PO TABS: 4.0000 mg | ORAL_TABLET | Freq: Four times a day (QID) | ORAL | Status: DC | PRN

## 2021-01-04 MED ORDER — POTASSIUM CHLORIDE IN NACL 20-0.9 MEQ/L-% IV SOLN
INTRAVENOUS | Status: DC
  Filled 2021-01-04 (×6): qty 1000

## 2021-01-04 MED ORDER — ACETAMINOPHEN 650 MG RE SUPP: 650.0000 mg | Freq: Four times a day (QID) | RECTAL | Status: DC | PRN

## 2021-01-04 MED ORDER — ONDANSETRON HCL 4 MG/2ML IJ SOLN
4.0000 mg | Freq: Four times a day (QID) | INTRAMUSCULAR | Status: DC | PRN
  Administered 2021-01-04: 23:00:00 4 mg via INTRAVENOUS
  Filled 2021-01-04: qty 2

## 2021-01-04 MED ORDER — ENOXAPARIN SODIUM 40 MG/0.4ML IJ SOSY
40.0000 mg | PREFILLED_SYRINGE | INTRAMUSCULAR | Status: DC
  Filled 2021-01-04: qty 0.4

## 2021-01-04 NOTE — H&P (Signed)
[place]   PATIENT NAME: Brian Decker    MR#:  [id number]  DATE OF BIRTH:  10-04-1995  DATE OF ADMISSION:  01/04/2021  PRIMARY CARE PHYSICIAN: Patient, No Pcp Per (Inactive)   Patient is coming from: Home  REQUESTING/REFERRING PHYSICIAN: Willy Eddy, MD  CHIEF COMPLAINT:   Chief Complaint  Patient presents with   Weakness    HISTORY OF PRESENT ILLNESS:  Brian Decker. is a 25 y.o. male with medical history significant for asthma, polysubstance abuse including cocaine, heroin and methamphetamine as well as marijuana, who presented to the ER with acute onset of generalized weakness.  He has not been having much appetite lately.  He has been drinking a lot of water though.  He admitted to nausea without vomiting and has been having diarrhea with watery bowel movements without melena or bright red bleeding per rectum.  He denies any fever or chills.  He is from [place] and is currently homeless.  He has been in the streets in the heat.  No dyspnea or cough or wheezing.  No headache or dizziness or blurred vision. He smokes marijuana on a regular basis.  He did methamphetamine about 3 days ago but prior to that was 9 months ago.  He also admitted to heroin 3 days ago as well.  He denies any history of depression.  ED Course: When he came to the ER vital signs were within normal.  Later on heart rate was 106 and respiratory rate was 21.  Labs revealed hyponatremia 121 compared to 137 on 08/15/2020 with a chloride of 76 compared to 101 then, creatinine of 1.41 compared to 1.17 then and a BN of 33 compared to 14 then.  Anion gap was 18.  Blood glucose was 107.  CK was 285 and CBC showed leukocytosis of 14.  Urinalysis was remarkable for 20 ketones.  Urine drug screen came back positive for marijuana.  The patient was given 2 L bolus of IV normal saline.  He will be admitted to a medical bed for further evaluation and management. PAST MEDICAL HISTORY:   Past Medical History:   Diagnosis Date   Asthma    Cocaine abuse (HCC)    Crack cocaine use    Heroin abuse (HCC) 04/07/2020   Methamphetamine use (HCC) 04/07/2020  Marijuana abuse  PAST SURGICAL HISTORY:  History reviewed. No pertinent surgical history.  He denies any previous surgeries.  SOCIAL HISTORY:   Social History   Tobacco Use   Smoking status: Every Day    Packs/day: 0.50    Pack years: 0.00    Types: Cigarettes   Smokeless tobacco: Never  Substance Use Topics   Alcohol use: Yes    Comment: occ    FAMILY HISTORY:  History reviewed. No pertinent family history.  DRUG ALLERGIES:   Allergies  Allergen Reactions   Bee Venom Anaphylaxis    REVIEW OF SYSTEMS:   ROS As per history of present illness. All pertinent systems were reviewed above. Constitutional, HEENT, cardiovascular, respiratory, GI, GU, musculoskeletal, neuro, psychiatric, endocrine, integumentary and hematologic systems were reviewed and are otherwise negative/unremarkable except for positive findings mentioned above in the HPI.   MEDICATIONS AT HOME:   Prior to Admission medications   Medication Sig Start Date End Date Taking? Authorizing Provider  mupirocin ointment (BACTROBAN) 2 % Apply to affected area 3 times daily 03/27/20 03/27/21  Don Perking, [place], MD      VITAL SIGNS:  Blood pressure (!) 143/79, pulse  89, temperature 98.4 F (36.9 C), temperature source Oral, resp. rate (!) 21, SpO2 100 %.  PHYSICAL EXAMINATION:  Physical Exam  GENERAL:  25 y.o.-year-old Caucasian male patient lying in the bed with no acute distress.  EYES: Pupils equal, round, reactive to light and accommodation. No scleral icterus. Extraocular muscles intact.  HEENT: Head atraumatic, normocephalic. Oropharynx with dry mucous membrane and tongue and nasopharynx clear.  NECK:  Supple, no jugular venous distention. No thyroid enlargement, no tenderness.  LUNGS: Normal breath sounds bilaterally, no wheezing, rales,rhonchi or crepitation.  No use of accessory muscles of respiration.  CARDIOVASCULAR: Regular rate and rhythm, S1, S2 normal. No murmurs, rubs, or gallops.  ABDOMEN: Soft, nondistended, nontender. Bowel sounds present. No organomegaly or mass.  EXTREMITIES: No pedal edema, cyanosis, or clubbing.  NEUROLOGIC: Cranial nerves II through XII are intact. Muscle strength 5/5 in all extremities. Sensation intact. Gait not checked.  PSYCHIATRIC: The patient is alert and oriented x 3.  Normal affect and good eye contact. SKIN: Warm and flushed with few back abrasions.  LABORATORY PANEL:   CBC Recent Labs  Lab 01/04/21 1520  WBC 14.0*  HGB 16.7  HCT 45.4  PLT 367   ------------------------------------------------------------------------------------------------------------------  Chemistries  Recent Labs  Lab 01/04/21 1520  NA 121*  K 3.6  CL 76*  CO2 27  GLUCOSE 107*  BUN 33*  CREATININE 1.41*  CALCIUM 9.8   ------------------------------------------------------------------------------------------------------------------  Cardiac Enzymes No results for input(s): TROPONINI in the last 168 hours. ------------------------------------------------------------------------------------------------------------------  RADIOLOGY:  No results found.    IMPRESSION AND PLAN:  Active Problems:   Hyponatremia  1.  Symptomatic hyponatremia, with generalized weakness, likely hypovolemic from nausea and diarrhea and exacerbated by excess water intake - The patient be admitted to a Medical bed. - We will obtain hyponatremia work-up. - The patient will be hydrated with IV normal saline. - We will follow his BMP. - He will be on fluid restrictions.  2.  Polysubstance abuse including marijuana, methamphetamine, heroin and cocaine. - The patient was counseled for cessation. - Will be monitored for withdrawal.  DVT prophylaxis: Lovenox. Code Status: full code. Family Communication:  The plan of care was discussed  in details with the patient (no family members are available as the patient is from [place]). I answered all questions. The patient agreed to proceed with the above mentioned plan. Further management will depend upon hospital course. Disposition Plan: Back to previous home environment Consults called: none. All the records are reviewed and case discussed with ED provider.  Status is: Inpatient  Remains inpatient appropriate because:Ongoing diagnostic testing needed not appropriate for outpatient work up, Unsafe d/c plan, IV treatments appropriate due to intensity of illness or inability to take PO, and Inpatient level of care appropriate due to severity of illness  Dispo: The patient is from:  Homeless              Anticipated d/c is to:  Per case management              Patient currently is not medically stable to d/c.   Difficult to place patient No    TOTAL TIME TAKING CARE OF THIS PATIENT: 50 minutes.    Hannah Beat M.D on 01/04/2021 at 10:14 PM  [facility]   From 7 PM-7 AM, contact night-coverage [email or web address]  CC: Primary care physician; Patient, No Pcp Per (Inactive)

## 2021-01-04 NOTE — ED Provider Notes (Signed)
[facility] Emergency Department Provider Note    Event Date/Time   First MD Initiated Contact with Patient 01/04/21 1925     (approximate)  I have reviewed the triage vital signs and the nursing notes.   HISTORY  Chief Complaint Weakness    HPI Brian Decker. is a 25 y.o. male the below listed past medical history presents to the ER for evaluation generalized malaise dehydration and hunger.  Patient states that he is homeless.  States he has been using meth and has not had much to eat or drink other than water over the past four days.  Has been outside during major heat wave over the past 2 days as well.  Denies any numbness or tingling.  Denies any SI or HI.  Demonstrates he does not have any interest in detox from substance abuse.  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 No pertinent family history. History reviewed. No pertinent surgical history. 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 Authorizing Provider  mupirocin ointment (BACTROBAN) 2 % Apply to affected area 3 times daily 03/27/20 03/27/21  Nita Sickle, MD    Allergies Bee venom    Social History Social History   Tobacco Use   Smoking status: Every Day    Packs/day: 0.50    Pack years: 0.00    Types: Cigarettes   Smokeless tobacco: Never  Vaping Use   Vaping Use: Every day  Substance Use Topics   Alcohol use: Yes    Comment: occ   Drug use: Yes    Types: Marijuana, IV, Cocaine, Methamphetamines    Comment: heroin    Review of  Systems Patient denies headaches, rhinorrhea, blurry vision, numbness, shortness of breath, chest pain, edema, cough, abdominal pain, nausea, vomiting, diarrhea, dysuria, fevers, rashes or hallucinations unless otherwise stated above in HPI. ____________________________________________   PHYSICAL EXAM:  VITAL SIGNS: Vitals:   01/04/21 2015 01/04/21 2030  BP:    Pulse:  80  Resp: 15 (!) 21  Temp:    SpO2:  100%    Constitutional: Alert and oriented.  Eyes: Conjunctivae are normal.  Head: Atraumatic. Nose: No congestion/rhinnorhea. Mouth/Throat: Mucous membranes are moist.   Neck: No stridor. Painless ROM.  Cardiovascular: Normal rate, regular rhythm. Grossly normal heart sounds.  Good peripheral circulation. Respiratory: Normal respiratory effort.  No retractions. Lungs CTAB. Gastrointestinal: Soft and nontender. No distention. No abdominal bruits. No CVA tenderness. Genitourinary:  Musculoskeletal: No lower extremity tenderness nor edema.  No joint effusions. Neurologic:  Normal speech and language. No gross focal neurologic deficits are appreciated. No facial droop Skin:  Skin is warm, dry and intact. No rash noted. Psychiatric: Mood and affect are normal. Speech and behavior are normal.  ____________________________________________   LABS (all labs ordered are listed, but only abnormal results are displayed)  Results for orders placed or performed during the hospital encounter of 01/04/21 (from the past 24 hour(s))  Basic metabolic panel     Status: Abnormal   Collection Time: 01/04/21  3:20 PM  Result Value Ref Range   Sodium 121 (L) 135 - 145 mmol/L   Potassium 3.6 3.5 - 5.1 mmol/L   Chloride 76 (L) 98 - 111 mmol/L   CO2 27 22 - 32 mmol/L   Glucose, Bld 107 (H) 70 - 99 mg/dL   BUN 33 (H) 6 - 20 mg/dL   Creatinine, Ser 0.16 (H) 0.61 - 1.24 mg/dL   Calcium 9.8 8.9 - 55.3 mg/dL   GFR, Estimated >74 >82 mL/min   Anion gap 18 (H) 5 - 15  CBC     Status: Abnormal    Collection Time: 01/04/21  3:20 PM  Result Value Ref Range   WBC 14.0 (H) 4.0 - 10.5 K/uL   RBC 5.52 4.22 - 5.81 MIL/uL   Hemoglobin 16.7 13.0 - 17.0 g/dL   HCT 70.7 86.7 - 54.4 %   MCV 82.2 80.0 - 100.0 fL   MCH 30.3 26.0 - 34.0 pg   MCHC 36.8 (H) 30.0 - 36.0 g/dL   RDW 92.0 10.0 - 71.2 %   Platelets 367 150 - 400 K/uL   nRBC 0.0 0.0 - 0.2 %  Urinalysis, Complete w Microscopic Urine, Clean Catch     Status: Abnormal   Collection Time: 01/04/21  3:20 PM  Result Value Ref Range   Color, Urine YELLOW (A) YELLOW   APPearance CLEAR (A) CLEAR   Specific Gravity, Urine 1.019 1.005 - 1.030   pH 5.0 5.0 - 8.0   Glucose, UA NEGATIVE NEGATIVE mg/dL   Hgb urine dipstick NEGATIVE NEGATIVE   Bilirubin Urine NEGATIVE NEGATIVE   Ketones, ur 20 (A) NEGATIVE mg/dL   Protein, ur NEGATIVE NEGATIVE mg/dL   Nitrite NEGATIVE NEGATIVE   Leukocytes,Ua NEGATIVE NEGATIVE   WBC, UA 0-5 0 - 5 WBC/hpf   Bacteria, UA NONE SEEN NONE SEEN   Squamous Epithelial / LPF NONE SEEN 0 - 5   Hyaline Casts, UA PRESENT   CK     Status: None   Collection Time: 01/04/21  3:20 PM  Result Value Ref Range   Total CK 285 49 - 397 U/L  Urine Drug Screen, Qualitative ([facility] only)     Status: Abnormal   Collection Time: 01/04/21  9:08 PM  Result Value Ref Range   Tricyclic, Ur Screen NONE DETECTED NONE DETECTED   Amphetamines, Ur Screen NONE DETECTED NONE DETECTED   MDMA (Ecstasy)Ur Screen NONE DETECTED NONE DETECTED   Cocaine Metabolite,Ur [place] NONE DETECTED NONE DETECTED   Opiate, Ur Screen NONE DETECTED NONE DETECTED   Phencyclidine (PCP) Ur S NONE DETECTED NONE DETECTED   Cannabinoid 50 Ng, Ur [place] POSITIVE (A) NONE DETECTED   Barbiturates, Ur Screen NONE DETECTED NONE DETECTED   Benzodiazepine, Ur Scrn NONE DETECTED NONE DETECTED   Methadone Scn, Ur NONE DETECTED NONE DETECTED    ____________________________________________ ____________________________________________  RADIOLOGY   ____________________________________________   PROCEDURES  Procedure(s) performed:  Procedures    Critical Care performed: no ____________________________________________   INITIAL IMPRESSION / ASSESSMENT AND PLAN / ED COURSE  Pertinent labs & imaging results that were available during my care of the patient were reviewed by me and considered in my medical decision making (see chart for details).   DDX: dehydration, rhabdo, heat illness, substance abuse, aki  [employer] is a 25 y.o. who presents to the ED with presentation as described above generalized malaise and weakness feeling dehydrated.  Patient blood work evidence of evidence of acute hyponatremia.  No seizure-like activity.  Neuro  exam reassuring.  No rhabdo.  As the patient is homeless with evidence of acute hyponatremia I have ordered IV fluids and will discuss with hospitalist for observation for additional IV hydration electrolyte replacement.  Have discussed with the patient and available family all diagnostics and treatments performed thus far and all questions were answered to the best of my ability. The patient demonstrates understanding and agreement with plan.      The patient was evaluated in Emergency Department today for the symptoms described in the history of present illness. He/she was evaluated in the context of the global COVID-19 pandemic, which necessitated consideration that the patient might be at risk for infection with the SARS-CoV-2 virus that causes COVID-19. Institutional protocols and algorithms that pertain to the evaluation of patients at risk for COVID-19 are in a state of rapid change based on information released by regulatory bodies including the CDC and federal and state organizations. These policies and algorithms were followed during the patient's care in the ED.  As part of my  medical decision making, I reviewed the following data within the electronic MEDICAL RECORD NUMBER Nursing notes reviewed and incorporated, Labs reviewed, notes from prior ED visits and [place] Controlled Substance Database   ____________________________________________   FINAL CLINICAL IMPRESSION(S) / ED DIAGNOSES  Final diagnoses:  Hyponatremia      NEW MEDICATIONS STARTED DURING THIS VISIT:  New Prescriptions   No medications on file     Note:  This document was prepared using Dragon voice recognition software and may include unintentional dictation errors.    Willy Eddy, MD 01/04/21 2149

## 2021-01-04 NOTE — ED Notes (Signed)
Pt transported to floor with RN via wheelchair

## 2021-01-04 NOTE — ED Notes (Signed)
22:15 medications have not been verified by pharmacy at this time.

## 2021-01-04 NOTE — ED Notes (Signed)
Pt states he is feeling better, but not a 100%. Meal tray provided. Pt is on cardiac, bp and pulse ox monitor. Meal tray provided with providers approval

## 2021-01-04 NOTE — Progress Notes (Signed)
Pt arrived to floor from ED in no acute distress. Oriented to room and call bell. PRN trazodone given to aid with sleep, zofran for mild nausea. Attempted to call patient's mother, Jamesetta Orleans [phone], [phone] with no answer. Will attempt again in the AM per patient request.

## 2021-01-04 NOTE — ED Notes (Signed)
First Nurse Note: Pt to ED via [facility] for generalized weakness. Pt has been outside for the past 3 days and has not eaten. Pt has used meth in the last 24 hours.

## 2021-01-04 NOTE — ED Triage Notes (Addendum)
Pt arrived via EMS from street where he called due to generalized weakness after walking in the heat. Pt reports he is homeless and has not been eating or drinking water over the last 2-3 days. Pt denies drugs and alcohol use. Pt denies pain and sts, "Im just really weak." Pt reports using meth and heroine for the first time in 9 months, 3 days ago. Pt denies SI and HI and does not want help with substance abuse.

## 2021-01-04 NOTE — ED Notes (Addendum)
Pt in triage stating, "You mean there is no place for me to go? Like no bed? Are you fucking serious? So this means I am going to have to go back out there and be sitting?" Pt informed of wait time and process of triage. Pt sts, "I cant just get some fluids and go?" Pt advised that we are unable to do that without a full assessment and MD order. Pt sts, "Im just really ill right now and angry."

## 2021-01-04 NOTE — ED Notes (Signed)
Pt states coming in because he has not been eating for the last several days. Pt states he is a previous IV drug user. RN attempted IV access x2, bilateral AC. Unable to obtain IV access. Provider notified and order placed for IV team. Provider asked pt to drink PO fluids. Water provided.

## 2021-01-05 DIAGNOSIS — D72828 Other elevated white blood cell count: Secondary | ICD-10-CM

## 2021-01-05 LAB — CBC
HCT: 40.3 % (ref 39.0–52.0)
Hemoglobin: 14.5 g/dL (ref 13.0–17.0)
MCH: 30.5 pg (ref 26.0–34.0)
MCHC: 36 g/dL (ref 30.0–36.0)
MCV: 84.7 fL (ref 80.0–100.0)
Platelets: 278 10*3/uL (ref 150–400)
RBC: 4.76 MIL/uL (ref 4.22–5.81)
RDW: 12.8 % (ref 11.5–15.5)
WBC: 11 10*3/uL — ABNORMAL HIGH (ref 4.0–10.5)
nRBC: 0 % (ref 0.0–0.2)

## 2021-01-05 LAB — BASIC METABOLIC PANEL
Anion gap: 10 (ref 5–15)
BUN: 23 mg/dL — ABNORMAL HIGH (ref 6–20)
CO2: 29 mmol/L (ref 22–32)
Calcium: 8.7 mg/dL — ABNORMAL LOW (ref 8.9–10.3)
Chloride: 89 mmol/L — ABNORMAL LOW (ref 98–111)
Creatinine, Ser: 1.19 mg/dL (ref 0.61–1.24)
GFR, Estimated: >60 mL/min (ref >60)
Glucose, Bld: 91 mg/dL (ref 70–99)
Potassium: 3.6 mmol/L (ref 3.5–5.1)
Sodium: 128 mmol/L — ABNORMAL LOW (ref 135–145)

## 2021-01-05 LAB — OSMOLALITY: Osmolality: 269 mOsm/kg — ABNORMAL LOW (ref 275–295)

## 2021-01-05 LAB — HIV ANTIBODY (ROUTINE TESTING W REFLEX): HIV Screen 4th Generation wRfx: NONREACTIVE

## 2021-01-05 LAB — OSMOLALITY, URINE: Osmolality, Ur: 679 mOsm/kg (ref 300–900)

## 2021-01-05 LAB — SODIUM: Sodium: 133 mmol/L — ABNORMAL LOW (ref 135–145)

## 2021-01-05 MED ORDER — ENSURE ENLIVE PO LIQD
237.0000 mL | Freq: Three times a day (TID) | ORAL | Status: DC
  Administered 2021-01-05 – 2021-01-06 (×3): 237 mL via ORAL

## 2021-01-05 MED ORDER — ADULT MULTIVITAMIN W/MINERALS CH
1.0000 | ORAL_TABLET | Freq: Every day | ORAL | Status: DC
  Administered 2021-01-06: 09:00:00 1 via ORAL
  Filled 2021-01-05: qty 1

## 2021-01-05 NOTE — Progress Notes (Signed)
PROGRESS NOTE    Brian Decker.  ATF:[id number] DOB: 1996-02-17 DOA: 01/04/2021 PCP: Patient, No Pcp Per (Inactive)    Assessment & Plan:   Active Problems:   Hyponatremia  Hyponatremia: continue on IVFs. Na level is trending up.   Polysubstance abuse: w/ marijuana, methamphetamine, heroin & cocaine. Illicit drug use cessation counseling   Leukocytosis: likely reactive. Will continue to monitor   AKI: resolved   DVT prophylaxis: lovenox  Code Status: full Family Communication:  Disposition Plan: d/c back home.  Level of care: Med-Surg  Status is: Inpatient  Remains inpatient appropriate because:Persistent severe electrolyte disturbances  Dispo: The patient is from: Home              Anticipated d/c is to: Home              Patient currently is not medically stable to d/c.   Difficult to place patient : unclear    Consultants:    Procedures:   Antimicrobials:    Subjective: Pt c/o being thristy   Objective: Vitals:   01/04/21 2152 01/04/21 2230 01/04/21 2307 01/05/21 0526  BP: (!) 143/79 138/79 (!) 151/94 (!) 141/85  Pulse: 89 81 98 69  Resp: (!) 21 (!) 21 (!) 21 16  Temp:   97.6 F (36.4 C) 98.2 F (36.8 C)  TempSrc:   Oral Oral  SpO2: 100% 100% 100% 98%  Weight:   71 kg   Height:   6' (1.829 m)     Intake/Output Summary (Last 24 hours) at 01/05/2021 0716 Last data filed at 01/05/2021 0354 Gross per 24 hour  Intake 1379.36 ml  Output 275 ml  Net 1104.36 ml   Filed Weights   01/04/21 2307  Weight: 71 kg    Examination:  General exam: Appears calm and comfortable  Respiratory system: Clear to auscultation. Respiratory effort normal. Cardiovascular system: S1 & S2 +. No rubs, gallops or clicks. No pedal edema. Gastrointestinal system: Abdomen is nondistended, soft and nontender. Normal bowel sounds heard. Central nervous system: Alert and oriented. Moves all 4 extremities  Psychiatry: Judgement and insight appear normal. Mood & affect  appropriate.     Data Reviewed: I have personally reviewed following labs and imaging studies  CBC: Recent Labs  Lab 01/04/21 1520 01/05/21 0412  WBC 14.0* 11.0*  HGB 16.7 14.5  HCT 45.4 40.3  MCV 82.2 84.7  PLT 367 278   Basic Metabolic Panel: Recent Labs  Lab 01/04/21 1520 01/05/21 0412  NA 121* 128*  K 3.6 3.6  CL 76* 89*  CO2 27 29  GLUCOSE 107* 91  BUN 33* 23*  CREATININE 1.41* 1.19  CALCIUM 9.8 8.7*   GFR: Estimated Creatinine Clearance: 97 mL/min (by C-G formula based on SCr of 1.19 mg/dL). Liver Function Tests: No results for input(s): AST, ALT, ALKPHOS, BILITOT, PROT, ALBUMIN in the last 168 hours. No results for input(s): LIPASE, AMYLASE in the last 168 hours. No results for input(s): AMMONIA in the last 168 hours. Coagulation Profile: No results for input(s): INR, PROTIME in the last 168 hours. Cardiac Enzymes: Recent Labs  Lab 01/04/21 1520  CKTOTAL 285   BNP (last 3 results) No results for input(s): PROBNP in the last 8760 hours. HbA1C: No results for input(s): HGBA1C in the last 72 hours. CBG: No results for input(s): GLUCAP in the last 168 hours. Lipid Profile: No results for input(s): CHOL, HDL, LDLCALC, TRIG, CHOLHDL, LDLDIRECT in the last 72 hours. Thyroid Function Tests: Recent Labs  01/04/21 1520  TSH 0.182*   Anemia Panel: No results for input(s): VITAMINB12, FOLATE, FERRITIN, TIBC, IRON, RETICCTPCT in the last 72 hours. Sepsis Labs: No results for input(s): PROCALCITON, LATICACIDVEN in the last 168 hours.  Recent Results (from the past 240 hour(s))  Resp Panel by RT-PCR (Flu A&B, Covid) Nasopharyngeal Swab     Status: None   Collection Time: 01/04/21  9:55 PM   Specimen: Nasopharyngeal Swab; Nasopharyngeal(NP) swabs in vial transport medium  Result Value Ref Range Status   SARS Coronavirus 2 by RT PCR NEGATIVE NEGATIVE Final    Comment: (NOTE) SARS-CoV-2 target nucleic acids are NOT DETECTED.  The SARS-CoV-2 RNA is  generally detectable in upper respiratory specimens during the acute phase of infection. The lowest concentration of SARS-CoV-2 viral copies this assay can detect is 138 copies/mL. A negative result does not preclude SARS-Cov-2 infection and should not be used as the sole basis for treatment or other patient management decisions. A negative result may occur with  improper specimen collection/handling, submission of specimen other than nasopharyngeal swab, presence of viral mutation(s) within the areas targeted by this assay, and inadequate number of viral copies(<138 copies/mL). A negative result must be combined with clinical observations, patient history, and epidemiological information. The expected result is Negative.  Fact Sheet for Patients:  BloggerCourse.com  Fact Sheet for Healthcare Providers:  SeriousBroker.it  This test is no t yet approved or cleared by the [place] FDA and  has been authorized for detection and/or diagnosis of SARS-CoV-2 by FDA under an Emergency Use Authorization (EUA). This EUA will remain  in effect (meaning this test can be used) for the duration of the COVID-19 declaration under Section 564(b)(1) of the Act, 21 U.S.C.section 360bbb-3(b)(1), unless the authorization is terminated  or revoked sooner.       Influenza A by PCR NEGATIVE NEGATIVE Final   Influenza B by PCR NEGATIVE NEGATIVE Final    Comment: (NOTE) The Xpert Xpress SARS-CoV-2/FLU/RSV plus assay is intended as an aid in the diagnosis of influenza from Nasopharyngeal swab specimens and should not be used as a sole basis for treatment. Nasal washings and aspirates are unacceptable for Xpert Xpress SARS-CoV-2/FLU/RSV testing.  Fact Sheet for Patients: BloggerCourse.com  Fact Sheet for Healthcare Providers: SeriousBroker.it  This test is not yet approved or cleared by the [place] FDA and has been authorized for detection and/or diagnosis of SARS-CoV-2 by FDA under an Emergency Use Authorization (EUA). This EUA will remain in effect (meaning this test can be used) for the duration of the COVID-19 declaration under Section 564(b)(1) of the Act, 21 U.S.C. section 360bbb-3(b)(1), unless the authorization is terminated or revoked.  Performed at [facility], [address], [place], [place] [address]          Radiology Studies: No results found.      Scheduled Meds:  enoxaparin (LOVENOX) injection  40 mg Subcutaneous Q24H   Continuous Infusions:  0.9 % NaCl with KCl 20 mEq / L 100 mL/hr at 01/04/21 2355     LOS: 1 day    Time spent: 31 mins    Charise Killian, MD [facility] Pager [phone]  If 7PM-7AM, please contact night-coverage 01/05/2021, 7:16 AM

## 2021-01-05 NOTE — Progress Notes (Signed)
Initial Nutrition Assessment  DOCUMENTATION CODES:   Not applicable  INTERVENTION:   Ensure Enlive po TID, each supplement provides 350 kcal and 20 grams of protein  MVI po daily   Pt at high refeed risk; recommend monitor potassium, magnesium and phosphorus labs daily until stable  NUTRITION DIAGNOSIS:   Inadequate oral intake related to social / environmental circumstances as evidenced by per patient/family report.  GOAL:   Patient will meet greater than or equal to 90% of their needs  MONITOR:   PO intake, Supplement acceptance, Labs, Weight trends, Skin, I & O's  REASON FOR ASSESSMENT:   Malnutrition Screening Tool    ASSESSMENT:   25 y.o. male with medical history significant for asthma, polysubstance abuse and homelessness who is admitted with generalized weakness, poor oral intake and hyponatremia  RD working remotely.  Pt reports poor appetite and oral intake for 4 days pta. Pt reports that he is homeless and has little access to food. Pt also reports recent substance abuse. RD suspects pt with poor appetite and oral intake at baseline secondary to substance abuse. Pt's appetite continues to be poor in hospital; pt ate 25% of his breakfast this morning. RD will add supplements and MVI to help pt meet his estimated needs. Pt is at high refeed risk. Per chart, pt is down 23lbs(13%) over the past 5 months; this is significant weight loss. Pt is at high risk for malnutrition. RD will obtain nutrition related exam at follow-up.   Medications reviewed and include: lovenox, NaCl w/ Kcl @75ml /hr  Labs reviewed: Na 121(L), K 3.6 wnl, BUN 33(H), creat 1.41(H) Wbc- 14.0(H)  NUTRITION - FOCUSED PHYSICAL EXAM: Unable to perform at this time   Diet Order:   Diet Order             Diet Heart Room service appropriate? Yes; Fluid consistency: Thin; Fluid restriction: 1200 mL Fluid  Diet effective now                  EDUCATION NEEDS:   No education needs have been  identified at this time  Skin:  Skin Assessment: Reviewed RN Assessment  Last BM:  6/15  Height:   Ht Readings from Last 1 Encounters:  01/04/21 6' (1.829 m)    Weight:   Wt Readings from Last 1 Encounters:  01/04/21 71 kg    Ideal Body Weight:  80.9 kg  BMI:  Body mass index is 21.23 kg/m.  Estimated Nutritional Needs:   Kcal:  2300-2600kcal/day  Protein:  115-130g/day  Fluid:  2.2-2.5L/day  01/06/21 MS, RD, LDN Please refer to [facility] for RD and/or RD on-call/weekend/after hours pager

## 2021-01-06 LAB — CBC
HCT: 37.9 % — ABNORMAL LOW (ref 39.0–52.0)
Hemoglobin: 13.4 g/dL (ref 13.0–17.0)
MCH: 30.7 pg (ref 26.0–34.0)
MCHC: 35.4 g/dL (ref 30.0–36.0)
MCV: 86.9 fL (ref 80.0–100.0)
Platelets: 251 10*3/uL (ref 150–400)
RBC: 4.36 MIL/uL (ref 4.22–5.81)
RDW: 12.9 % (ref 11.5–15.5)
WBC: 8.1 10*3/uL (ref 4.0–10.5)
nRBC: 0 % (ref 0.0–0.2)

## 2021-01-06 LAB — BASIC METABOLIC PANEL
Anion gap: 5 (ref 5–15)
BUN: 14 mg/dL (ref 6–20)
CO2: 29 mmol/L (ref 22–32)
Calcium: 8.7 mg/dL — ABNORMAL LOW (ref 8.9–10.3)
Chloride: 101 mmol/L (ref 98–111)
Creatinine, Ser: 0.9 mg/dL (ref 0.61–1.24)
GFR, Estimated: >60 mL/min (ref >60)
Glucose, Bld: 98 mg/dL (ref 70–99)
Potassium: 3.7 mmol/L (ref 3.5–5.1)
Sodium: 135 mmol/L (ref 135–145)

## 2021-01-06 NOTE — Discharge Summary (Signed)
Physician Discharge Summary  Brian Decker. FBP:[id number] DOB: 01/13/1996 DOA: 01/04/2021  PCP: Patient, No Pcp Per (Inactive)  Admit date: 01/04/2021 Discharge date: 01/06/2021  Admitted From: home  Disposition:  home   Recommendations for Outpatient Follow-up:  Follow up with PCP in 1-2 weeks   Home Health: no  Equipment/Devices: n/a  Discharge Condition: stable  CODE STATUS: full  Diet recommendation: regular   Brief/Interim Summary: HPI was taken from Dr. Arville Care: Brian Decker. is a 25 y.o. male with medical history significant for asthma, polysubstance abuse including cocaine, heroin and methamphetamine as well as marijuana, who presented to the ER with acute onset of generalized weakness.  He has not been having much appetite lately.  He has been drinking a lot of water though.  He admitted to nausea without vomiting and has been having diarrhea with watery bowel movements without melena or bright red bleeding per rectum.  He denies any fever or chills.  He is from [place] and is currently homeless.  He has been in the streets in the heat.  No dyspnea or cough or wheezing.  No headache or dizziness or blurred vision. He smokes marijuana on a regular basis.  He did methamphetamine about 3 days ago but prior to that was 9 months ago.  He also admitted to heroin 3 days ago as well.  He denies any history of depression.  ED Course: When he came to the ER vital signs were within normal.  Later on heart rate was 106 and respiratory rate was 21.  Labs revealed hyponatremia 121 compared to 137 on 08/15/2020 with a chloride of 76 compared to 101 then, creatinine of 1.41 compared to 1.17 then and a BN of 33 compared to 14 then.  Anion gap was 18.  Blood glucose was 107.  CK was 285 and CBC showed leukocytosis of 14.  Urinalysis was remarkable for 20 ketones.  Urine drug screen came back positive for marijuana.   The patient was given 2 L bolus of IV normal saline.  He will be admitted to a  medical bed for further evaluation and management.  Hospital course from Dr. Mayford Knife 6/18-6/19/22: Pt was found to have hyponatremia likely secondary to having multiple episodes of diarrhea. Pt was treated conservatively w/ IVFs and pt's Na level was back WNL prior to d/c. Pt's diarrhea also resolved prior to d/c. For more information, please see other progress notes.   Discharge Diagnoses:  Active Problems:   Hyponatremia  Hyponatremia: resolved    Polysubstance abuse: w/ marijuana, methamphetamine, heroin & cocaine. Illicit drug use cessation counseling    Leukocytosis: resolved   AKI: resolved   Discharge Instructions  Discharge Instructions     Diet general   Complete by: As directed    Discharge instructions   Complete by: As directed    F/u w/ PCP in 1-2 weeks   Increase activity slowly   Complete by: As directed       Allergies as of 01/06/2021       Reactions   Bee Venom Anaphylaxis        Medication List     STOP taking these medications    mupirocin ointment 2 % Commonly known as: BACTROBAN        Allergies  Allergen Reactions   Bee Venom Anaphylaxis    Consultations:    Procedures/Studies: No results found.    Subjective: Pt denies any complaints    Discharge Exam: Vitals:   01/06/21 0010 01/06/21  0425  BP: 129/71 139/69  Pulse: 67 (!) 53  Resp: 16   Temp: 98.2 F (36.8 C) 98.2 F (36.8 C)  SpO2: 100% 100%   Vitals:   01/05/21 1533 01/05/21 2028 01/06/21 0010 01/06/21 0425  BP: 134/85 (!) 111/56 129/71 139/69  Pulse: (!) 52 73 67 (!) 53  Resp: 20 16 16    Temp: (!) 97.5 F (36.4 C) 98.4 F (36.9 C) 98.2 F (36.8 C) 98.2 F (36.8 C)  TempSrc:  Oral Oral Oral  SpO2: 100% 100% 100% 100%  Weight:      Height:        General: Pt is alert, awake, not in acute distress Cardiovascular: S1/S2 +, no rubs, no gallops Respiratory: CTA bilaterally, no wheezing, no rhonchi Abdominal: Soft, NT, ND, bowel sounds  + Extremities: no edema, no cyanosis    The results of significant diagnostics from this hospitalization (including imaging, microbiology, ancillary and laboratory) are listed below for reference.     Microbiology: Recent Results (from the past 240 hour(s))  Resp Panel by RT-PCR (Flu A&B, Covid) Nasopharyngeal Swab     Status: None   Collection Time: 01/04/21  9:55 PM   Specimen: Nasopharyngeal Swab; Nasopharyngeal(NP) swabs in vial transport medium  Result Value Ref Range Status   SARS Coronavirus 2 by RT PCR NEGATIVE NEGATIVE Final    Comment: (NOTE) SARS-CoV-2 target nucleic acids are NOT DETECTED.  The SARS-CoV-2 RNA is generally detectable in upper respiratory specimens during the acute phase of infection. The lowest concentration of SARS-CoV-2 viral copies this assay can detect is 138 copies/mL. A negative result does not preclude SARS-Cov-2 infection and should not be used as the sole basis for treatment or other patient management decisions. A negative result may occur with  improper specimen collection/handling, submission of specimen other than nasopharyngeal swab, presence of viral mutation(s) within the areas targeted by this assay, and inadequate number of viral copies(<138 copies/mL). A negative result must be combined with clinical observations, patient history, and epidemiological information. The expected result is Negative.  Fact Sheet for Patients:  01/06/21  Fact Sheet for Healthcare Providers:  BloggerCourse.com  This test is no t yet approved or cleared by the [place] FDA and  has been authorized for detection and/or diagnosis of SARS-CoV-2 by FDA under an Emergency Use Authorization (EUA). This EUA will remain  in effect (meaning this test can be used) for the duration of the COVID-19 declaration under Section 564(b)(1) of the Act, 21 U.S.C.section 360bbb-3(b)(1), unless the authorization  is terminated  or revoked sooner.       Influenza A by PCR NEGATIVE NEGATIVE Final   Influenza B by PCR NEGATIVE NEGATIVE Final    Comment: (NOTE) The Xpert Xpress SARS-CoV-2/FLU/RSV plus assay is intended as an aid in the diagnosis of influenza from Nasopharyngeal swab specimens and should not be used as a sole basis for treatment. Nasal washings and aspirates are unacceptable for Xpert Xpress SARS-CoV-2/FLU/RSV testing.  Fact Sheet for Patients: [place]  Fact Sheet for Healthcare Providers: BloggerCourse.com  This test is not yet approved or cleared by the [place]ious[place]ker.[place] FDA and has been authorized for detection and/or diagnosis of SARS-CoV-2 by FDA under an Emergency Use Authorization (EUA). This EUA will remain in effect (meaning this test can be used) for the duration of the COVID-19 declaration under Section 564(b)(1) of the Act, 21 U.S.C. section 360bbb-3(b)(1), unless the authorization is terminated or revoked.  Performed at [facility], [address] [place],  [place] [id number]      Labs: BNP (last 3 results) No results for input(s): BNP in the last 8760 hours. Basic Metabolic Panel: Recent Labs  Lab 01/04/21 1520 01/05/21 0412 01/05/21 1748 01/06/21 0415  NA 121* 128* 133* 135  K 3.6 3.6  --  3.7  CL 76* 89*  --  101  CO2 27 29  --  29  GLUCOSE 107* 91  --  98  BUN 33* 23*  --  14  CREATININE 1.41* 1.19  --  0.90  CALCIUM 9.8 8.7*  --  8.7*   Liver Function Tests: No results for input(s): AST, ALT, ALKPHOS, BILITOT, PROT, ALBUMIN in the last 168 hours. No results for input(s): LIPASE, AMYLASE in the last 168 hours. No results for input(s): AMMONIA in the last 168 hours. CBC: Recent Labs  Lab 01/04/21 1520 01/05/21 0412 01/06/21 0415  WBC 14.0* 11.0* 8.1  HGB 16.7 14.5 13.4  HCT 45.4 40.3 37.9*  MCV 82.2 84.7 86.9  PLT 367 278 251   Cardiac Enzymes: Recent Labs  Lab  01/04/21 1520  CKTOTAL 285   BNP: Invalid input(s): POCBNP CBG: No results for input(s): GLUCAP in the last 168 hours. D-Dimer No results for input(s): DDIMER in the last 72 hours. Hgb A1c No results for input(s): HGBA1C in the last 72 hours. Lipid Profile No results for input(s): CHOL, HDL, LDLCALC, TRIG, CHOLHDL, LDLDIRECT in the last 72 hours. Thyroid function studies Recent Labs    01/04/21 1520  TSH 0.182*   Anemia work up No results for input(s): VITAMINB12, FOLATE, FERRITIN, TIBC, IRON, RETICCTPCT in the last 72 hours. Urinalysis    Component Value Date/Time   COLORURINE YELLOW (A) 01/04/2021 1520   APPEARANCEUR CLEAR (A) 01/04/2021 1520   APPEARANCEUR Clear 09/27/2013 1642   LABSPEC 1.019 01/04/2021 1520   LABSPEC 1.021 09/27/2013 1642   PHURINE 5.0 01/04/2021 1520   GLUCOSEU NEGATIVE 01/04/2021 1520   GLUCOSEU Negative 09/27/2013 1642   HGBUR NEGATIVE 01/04/2021 1520   BILIRUBINUR NEGATIVE 01/04/2021 1520   BILIRUBINUR Negative 09/27/2013 1642   KETONESUR 20 (A) 01/04/2021 1520   PROTEINUR NEGATIVE 01/04/2021 1520   NITRITE NEGATIVE 01/04/2021 1520   LEUKOCYTESUR NEGATIVE 01/04/2021 1520   LEUKOCYTESUR Negative 09/27/2013 1642   Sepsis Labs Invalid input(s): PROCALCITONIN,  WBC,  LACTICIDVEN Microbiology Recent Results (from the past 240 hour(s))  Resp Panel by RT-PCR (Flu A&B, Covid) Nasopharyngeal Swab     Status: None   Collection Time: 01/04/21  9:55 PM   Specimen: Nasopharyngeal Swab; Nasopharyngeal(NP) swabs in vial transport medium  Result Value Ref Range Status   SARS Coronavirus 2 by RT PCR NEGATIVE NEGATIVE Final    Comment: (NOTE) SARS-CoV-2 target nucleic acids are NOT DETECTED.  The SARS-CoV-2 RNA is generally detectable in upper respiratory specimens during the acute phase of infection. The lowest concentration of SARS-CoV-2 viral copies this assay can detect is 138 copies/mL. A negative result does not preclude SARS-Cov-2 infection and  should not be used as the sole basis for treatment or other patient management decisions. A negative result may occur with  improper specimen collection/handling, submission of specimen other than nasopharyngeal swab, presence of viral mutation(s) within the areas targeted by this assay, and inadequate number of viral copies(<138 copies/mL). A negative result must be combined with clinical observations, patient history, and epidemiological information. The expected result is Negative.  Fact Sheet for Patients:  BloggerCourse.comhttps://www.fda.gov/media/152166/download  Fact Sheet for Healthcare Providers:  SeriousBroker.ithttps://www.fda.gov/media/152162/download  This test is no t  yet approved or cleared by the [place] and  has been authorized for detection and/or diagnosis of SARS-CoV-2 by FDA under an Emergency Use Authorization (EUA). This EUA will remain  in effect (meaning this test can be used) for the duration of the COVID-19 declaration under Section 564(b)(1) of the Act, 21 U.S.C.section 360bbb-3(b)(1), unless the authorization is terminated  or revoked sooner.       Influenza A by PCR NEGATIVE NEGATIVE Final   Influenza B by PCR NEGATIVE NEGATIVE Final    Comment: (NOTE) The Xpert Xpress SARS-CoV-2/FLU/RSV plus assay is intended as an aid in the diagnosis of influenza from Nasopharyngeal swab specimens and should not be used as a sole basis for treatment. Nasal washings and aspirates are unacceptable for Xpert Xpress SARS-CoV-2/FLU/RSV testing.  Fact Sheet for Patients: BloggerCourse.com  Fact Sheet for Healthcare Providers: SeriousBroker.it  This test is not yet approved or cleared by the [place] FDA and has been authorized for detection and/or diagnosis of SARS-CoV-2 by FDA under an Emergency Use Authorization (EUA). This EUA will remain in effect (meaning this test can be used) for the duration of the COVID-19 declaration  under Section 564(b)(1) of the Act, 21 U.S.C. section 360bbb-3(b)(1), unless the authorization is terminated or revoked.  Performed at [facility], [address] [place], [place] [address]      Time coordinating discharge: Over 30 minutes  SIGNED:   Charise Killian, MD  [facility] 01/06/2021, 8:24 AM Pager   If 7PM-7AM, please contact night-coverage

## 2021-01-06 NOTE — Progress Notes (Signed)
Patient to discharge today.  CSW provided shirt and shoes from clothing closet per patient request.  Patient initially asked for cab voucher, now says his brother will come pick him up.  Alfonso Ramus, [place] [phone]

## 2022-01-19 IMAGING — CT CT HEAD W/O CM
5 of 6 series · 17 of 47 positions shown, 18 images · non-contrast
Comparison: None.

CLINICAL DATA: Seizure

EXAM:
CT HEAD WITHOUT CONTRAST
TECHNIQUE: Contiguous axial images were obtained from the base of the skull
through the vertex without intravenous contrast.

[Series 2: head bone · axial · 0.44mm/px · z∈[-151,-45]mm · 7 of 77 slices shown]
[im 8/77  bone]
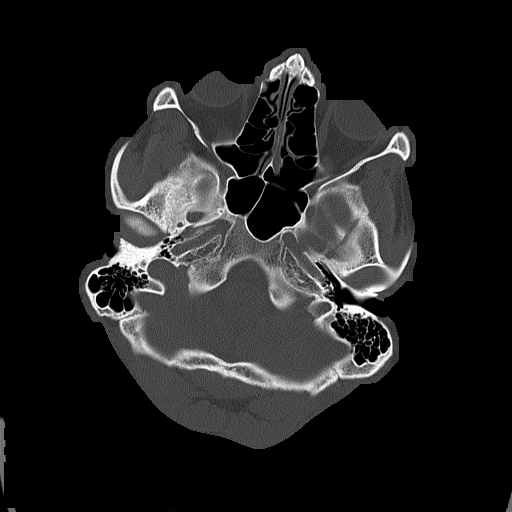
[im 16/77  bone]
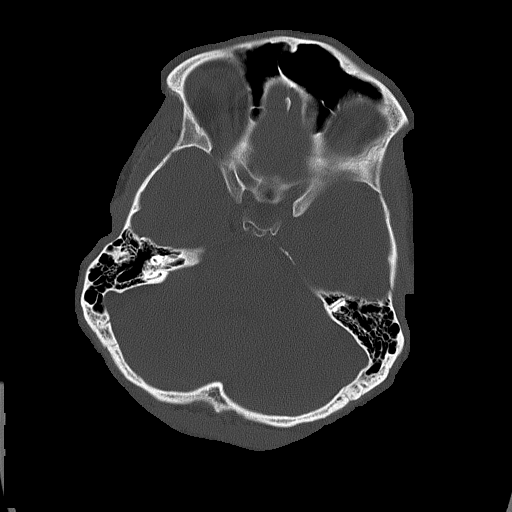
[im 23/77  bone]
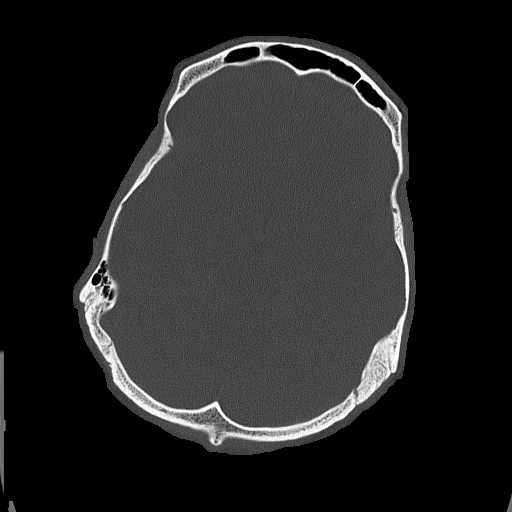
[im 31/77  bone]
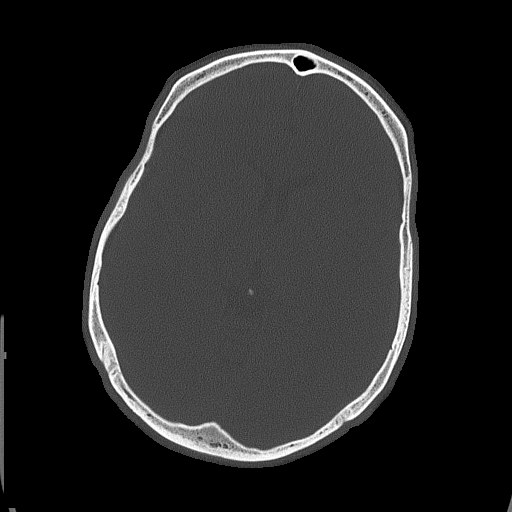
[im 46/77  bone]
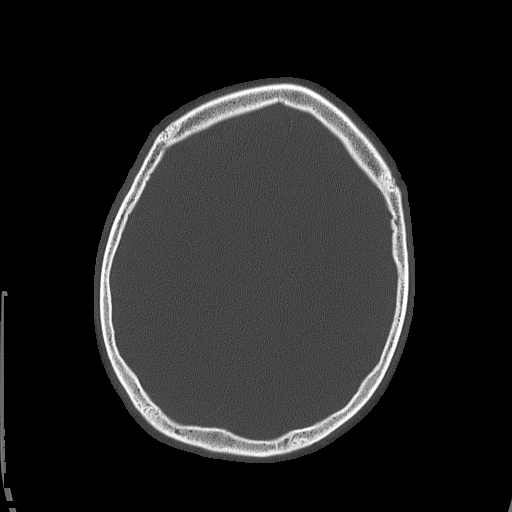
[im 54/77  bone]
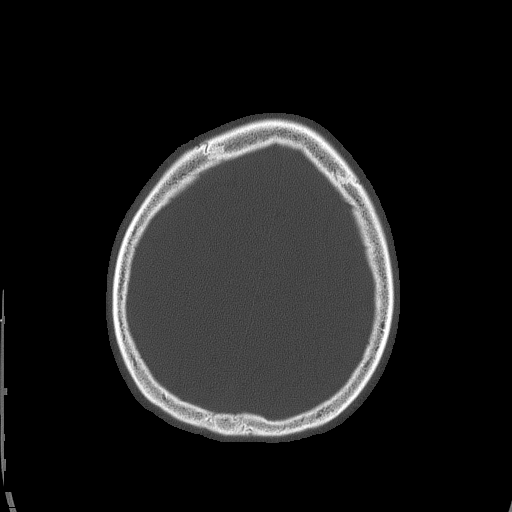
[im 61/77  bone]
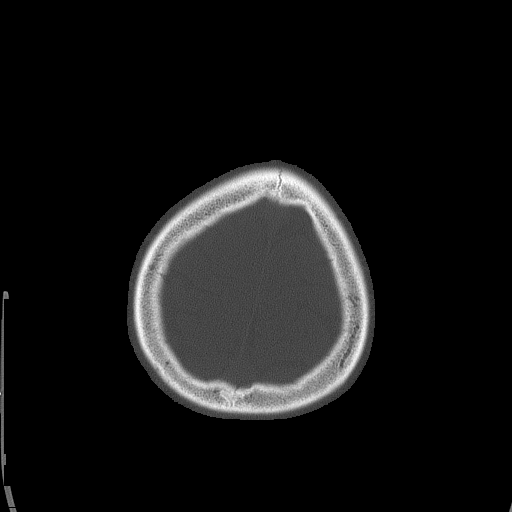

[Series 3: head wo · axial · 0.44mm/px · z∈[-120,-75]mm · 2 of 29 slices shown]
[im 10/29  brain]
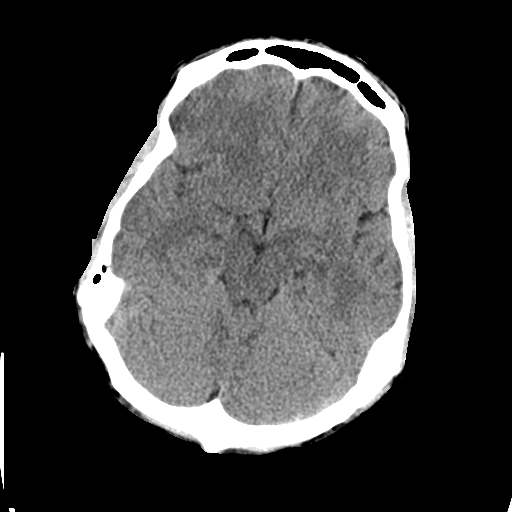
[im 19/29  brain]
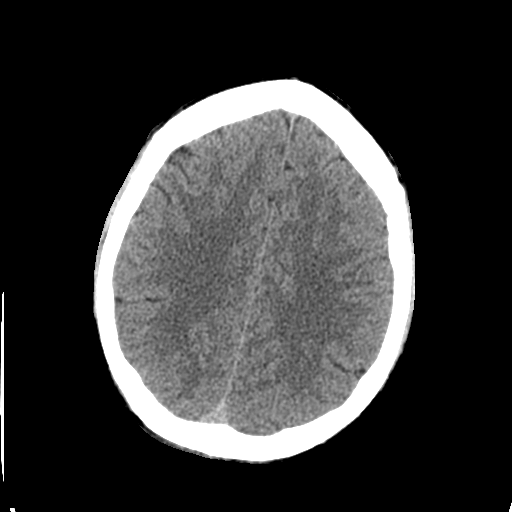

[Series 4: coronal soft tissue · coronal · 0.29mm/px · 3 of 62 slices shown]
[im 21/62  brain]
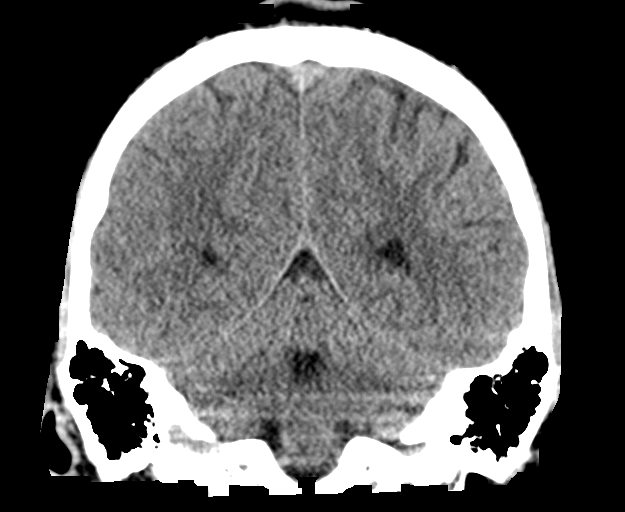
[im 28/62  brain]
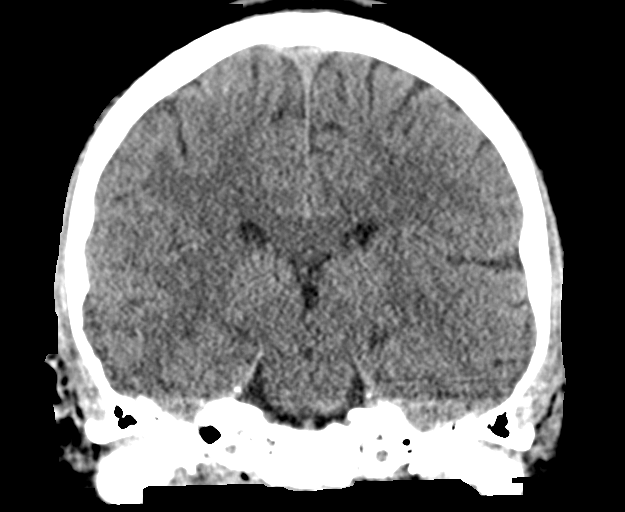
[im 34/62  brain]
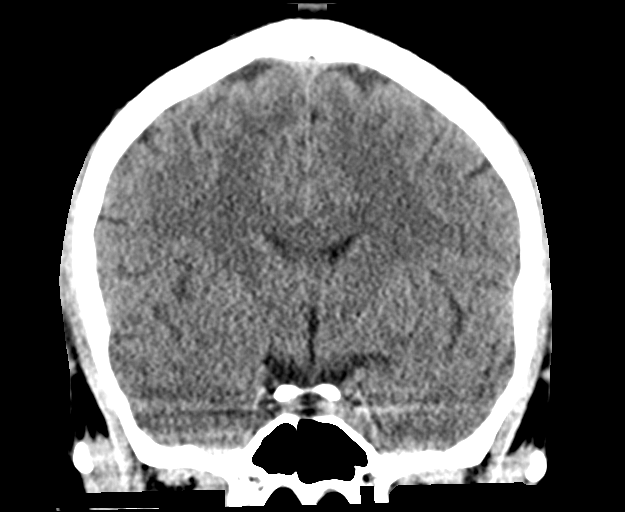

[Series 5: sagittal soft tissue · sagittal · 0.32mm/px · 3 of 53 slices shown]
[im 18/53  brain]
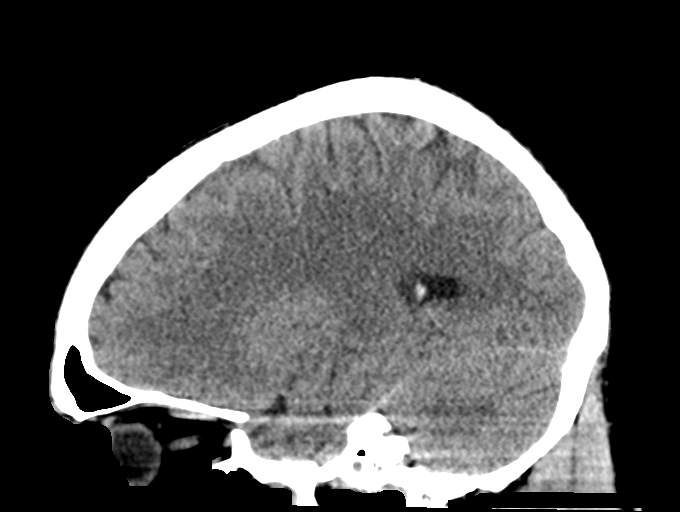
[im 27/53  brain]
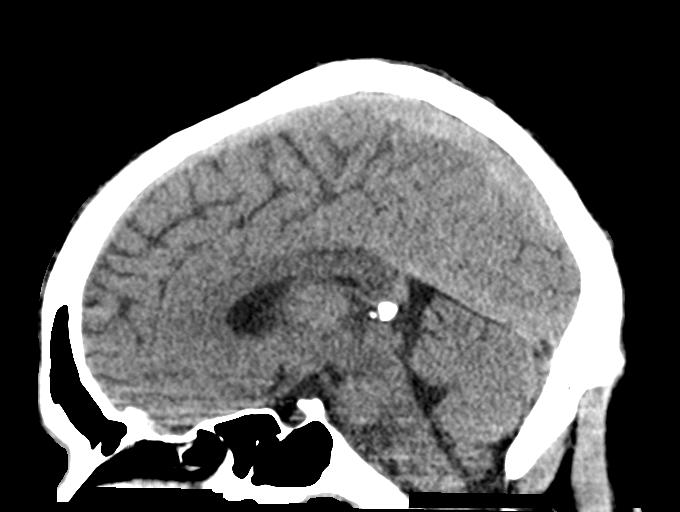
[im 35/53  brain]
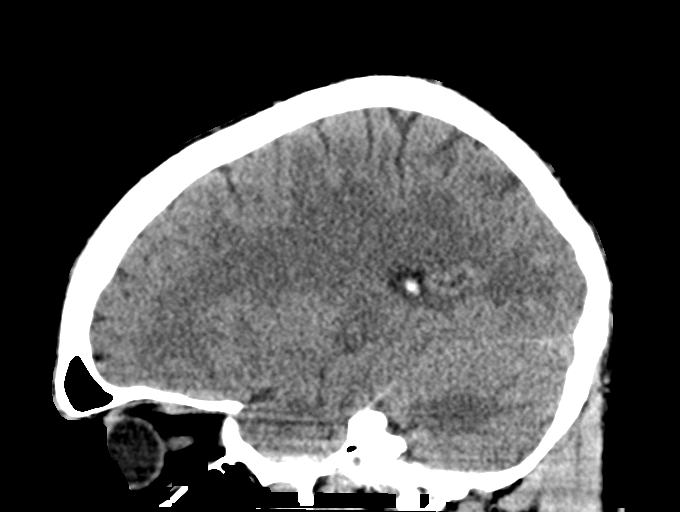

[Series 6: ax head wo · axial · 0.34mm/px · z∈[-116,-67]mm · 2 of 30 slices shown, 3 images]
[im 10/30  brain]
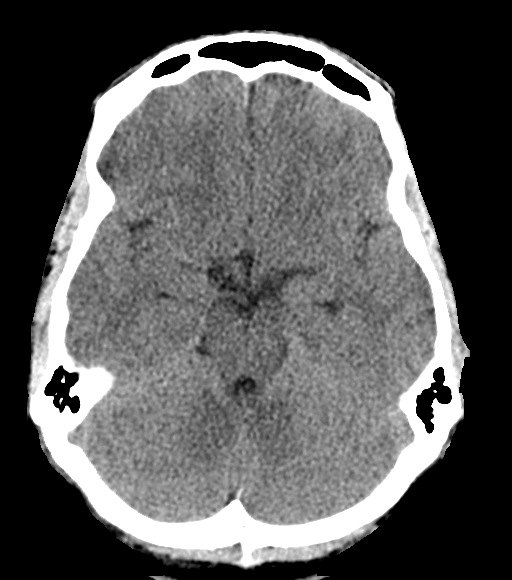
[im 10/30  bone]
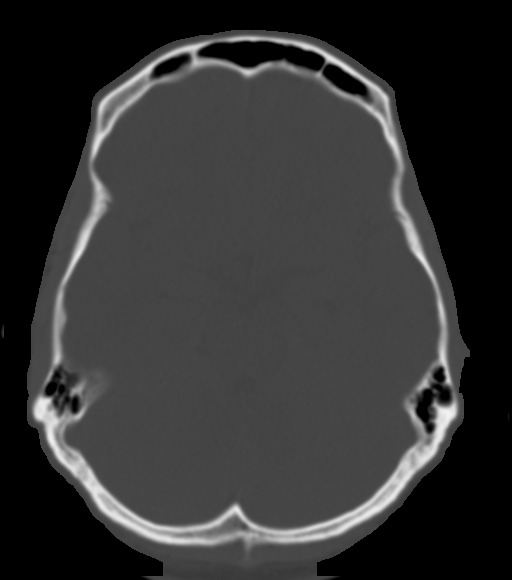
[im 20/30  brain]
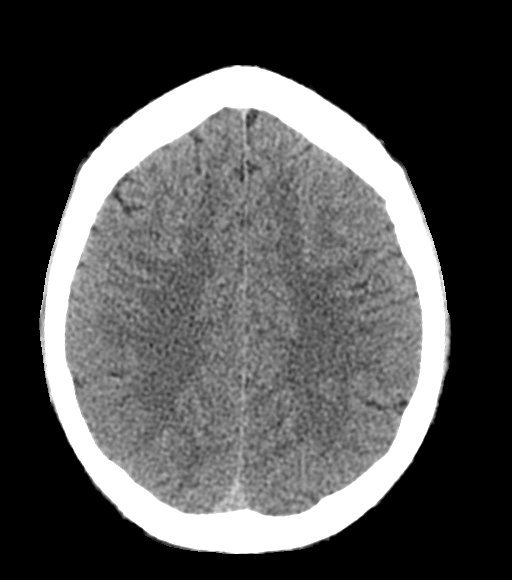

[17 of 47 positions shown; findings below may reference images not displayed]

FINDINGS: Brain: No evidence of acute infarction, hemorrhage, hydrocephalus,
extra-axial collection or mass lesion/mass effect.

Vascular: No hyperdense vessel or unexpected calcification.

Skull: Normal. Negative for fracture or focal lesion.

Sinuses/Orbits: No acute finding.

Other: None
IMPRESSION: Negative non contrasted CT appearance of the brain.

## 2022-07-27 ENCOUNTER — Emergency Department
Admission: EM | Admit: 2022-07-27 | Discharge: 2022-07-29 | Disposition: A | Discharge status: Home / Self Care | Payer: Medicaid Other | Attending: Emergency Medicine | Admitting: Emergency Medicine

## 2022-07-27 ENCOUNTER — Other Ambulatory Visit: Payer: Self-pay

## 2022-07-27 ENCOUNTER — Emergency Department
Admission: EM | Admit: 2022-07-27 | Discharge: 2022-07-27 | Disposition: A | Discharge status: Home / Self Care | Payer: Self-pay | Attending: Emergency Medicine | Admitting: Emergency Medicine

## 2022-07-27 ENCOUNTER — Emergency Department: Payer: Self-pay

## 2022-07-27 DIAGNOSIS — J9601 Acute respiratory failure with hypoxia: Secondary | ICD-10-CM | POA: Insufficient documentation

## 2022-07-27 DIAGNOSIS — Z1152 Encounter for screening for COVID-19: Secondary | ICD-10-CM | POA: Insufficient documentation

## 2022-07-27 DIAGNOSIS — F1910 Other psychoactive substance abuse, uncomplicated: Secondary | ICD-10-CM

## 2022-07-27 DIAGNOSIS — R4182 Altered mental status, unspecified: Secondary | ICD-10-CM | POA: Insufficient documentation

## 2022-07-27 DIAGNOSIS — R7989 Other specified abnormal findings of blood chemistry: Secondary | ICD-10-CM | POA: Insufficient documentation

## 2022-07-27 LAB — COMPREHENSIVE METABOLIC PANEL
ALT: 37 U/L (ref 0–44)
AST: 35 U/L (ref 15–41)
Albumin: 4.7 g/dL (ref 3.5–5.0)
Alkaline Phosphatase: 58 U/L (ref 38–126)
Anion gap: 13 (ref 5–15)
BUN: 17 mg/dL (ref 6–20)
CO2: 26 mmol/L (ref 22–32)
Calcium: 8.5 mg/dL — ABNORMAL LOW (ref 8.9–10.3)
Chloride: 98 mmol/L (ref 98–111)
Creatinine, Ser: 1.34 mg/dL — ABNORMAL HIGH (ref 0.61–1.24)
GFR, Estimated: 35 mL/min — ABNORMAL LOW (ref >60)
Glucose, Bld: 68 mg/dL — ABNORMAL LOW (ref 70–99)
Potassium: 3.2 mmol/L — ABNORMAL LOW (ref 3.5–5.1)
Sodium: 137 mmol/L (ref 135–145)
Total Bilirubin: 1.1 mg/dL (ref 0.3–1.2)
Total Protein: 7.7 g/dL (ref 6.5–8.1)

## 2022-07-27 LAB — ACETAMINOPHEN LEVEL: Acetaminophen (Tylenol), Serum: <10 ug/mL — ABNORMAL LOW (ref 10–30)

## 2022-07-27 LAB — URINE DRUG SCREEN, QUALITATIVE (ARMC ONLY)
Amphetamines, Ur Screen: POSITIVE — AB
Barbiturates, Ur Screen: NOT DETECTED
Benzodiazepine, Ur Scrn: POSITIVE — AB
Cannabinoid 50 Ng, Ur ~~LOC~~: POSITIVE — AB
Cocaine Metabolite,Ur ~~LOC~~: POSITIVE — AB
MDMA (Ecstasy)Ur Screen: NOT DETECTED
Methadone Scn, Ur: NOT DETECTED
Opiate, Ur Screen: NOT DETECTED
Phencyclidine (PCP) Ur S: NOT DETECTED
Tricyclic, Ur Screen: POSITIVE — AB

## 2022-07-27 LAB — CBC WITH DIFFERENTIAL/PLATELET
Abs Immature Granulocytes: 0.05 10*3/uL (ref 0.00–0.07)
Basophils Absolute: 0 10*3/uL (ref 0.0–0.1)
Basophils Relative: 0 %
Eosinophils Absolute: 0 10*3/uL (ref 0.0–0.5)
Eosinophils Relative: 0 %
HCT: 36.5 % — ABNORMAL LOW (ref 39.0–52.0)
Hemoglobin: 12.4 g/dL — ABNORMAL LOW (ref 13.0–17.0)
Immature Granulocytes: 1 %
Lymphocytes Relative: 9 %
Lymphs Abs: 0.9 10*3/uL (ref 0.7–4.0)
MCH: 30 pg (ref 26.0–34.0)
MCHC: 34 g/dL (ref 30.0–36.0)
MCV: 88.2 fL (ref 80.0–100.0)
Monocytes Absolute: 0.9 10*3/uL (ref 0.1–1.0)
Monocytes Relative: 8 %
Neutro Abs: 8.6 10*3/uL — ABNORMAL HIGH (ref 1.7–7.7)
Neutrophils Relative %: 82 %
Platelets: 228 10*3/uL (ref 150–400)
RBC: 4.14 MIL/uL — ABNORMAL LOW (ref 4.22–5.81)
RDW: 12.5 % (ref 11.5–15.5)
WBC: 10.5 10*3/uL (ref 4.0–10.5)
nRBC: 0 % (ref 0.0–0.2)

## 2022-07-27 LAB — SALICYLATE LEVEL: Salicylate Lvl: <7 mg/dL — ABNORMAL LOW (ref 7.0–30.0)

## 2022-07-27 LAB — RESP PANEL BY RT-PCR (RSV, FLU A&B, COVID)  RVPGX2
Influenza A by PCR: NEGATIVE
Influenza B by PCR: NEGATIVE
Resp Syncytial Virus by PCR: NEGATIVE
SARS Coronavirus 2 by RT PCR: NEGATIVE

## 2022-07-27 LAB — ETHANOL: Alcohol, Ethyl (B): <10 mg/dL (ref <10)

## 2022-07-27 LAB — URINALYSIS, ROUTINE W REFLEX MICROSCOPIC
Bilirubin Urine: NEGATIVE
Glucose, UA: 50 mg/dL — AB
Hgb urine dipstick: NEGATIVE
Ketones, ur: 20 mg/dL — AB
Leukocytes,Ua: NEGATIVE
Nitrite: NEGATIVE
Protein, ur: NEGATIVE mg/dL
Specific Gravity, Urine: 1.018 (ref 1.005–1.030)
pH: 5 (ref 5.0–8.0)

## 2022-07-27 LAB — LIPASE, BLOOD: Lipase: 31 U/L (ref 11–51)

## 2022-07-27 LAB — CK: Total CK: 211 U/L (ref 49–397)

## 2022-07-27 LAB — CBG MONITORING, ED
Glucose-Capillary: 69 mg/dL — ABNORMAL LOW (ref 70–99)
Glucose-Capillary: 89 mg/dL (ref 70–99)
Glucose-Capillary: 86 mg/dL (ref 70–99)

## 2022-07-27 LAB — TROPONIN I (HIGH SENSITIVITY): Troponin I (High Sensitivity): 16 ng/L (ref <18)

## 2022-07-27 LAB — PROCALCITONIN: Procalcitonin: 0.22 ng/mL

## 2022-07-27 MED ORDER — LORAZEPAM 2 MG/ML IJ SOLN
INTRAMUSCULAR | Status: AC
  Administered 2022-07-27: 2 mg
  Filled 2022-07-27: qty 1

## 2022-07-27 MED ORDER — HALOPERIDOL LACTATE 5 MG/ML IJ SOLN
INTRAMUSCULAR | Status: AC
  Filled 2022-07-27: qty 1

## 2022-07-27 MED ORDER — NALOXONE HCL 2 MG/2ML IJ SOSY
PREFILLED_SYRINGE | INTRAMUSCULAR | Status: AC
  Filled 2022-07-27: qty 2

## 2022-07-27 MED ORDER — DEXTROSE 50 % IV SOLN
1.0000 | Freq: Once | INTRAVENOUS | Status: AC
  Administered 2022-07-27: 50 mL via INTRAVENOUS
  Filled 2022-07-27: qty 50

## 2022-07-27 MED ORDER — LORAZEPAM 2 MG/ML IJ SOLN
INTRAMUSCULAR | Status: AC
  Filled 2022-07-27: qty 1

## 2022-07-27 MED ORDER — DIPHENHYDRAMINE HCL 50 MG/ML IJ SOLN
INTRAMUSCULAR | Status: AC
  Filled 2022-07-27: qty 1

## 2022-07-27 MED ORDER — SODIUM CHLORIDE 0.9 % IV BOLUS
1000.0000 mL | Freq: Once | INTRAVENOUS | Status: AC
  Administered 2022-07-27: 1000 mL via INTRAVENOUS

## 2022-08-03 ENCOUNTER — Emergency Department: Payer: Medicaid Other

## 2022-08-03 ENCOUNTER — Other Ambulatory Visit: Payer: Self-pay

## 2022-08-03 ENCOUNTER — Inpatient Hospital Stay
Admission: EM | Admit: 2022-08-03 | Discharge: 2022-08-06 | DRG: 917 | Disposition: A | Discharge status: Other Acute Inpatient Hospital | Payer: Self-pay | Attending: Internal Medicine | Admitting: Internal Medicine

## 2022-08-03 DIAGNOSIS — Z597 Insufficient social insurance and welfare support: Secondary | ICD-10-CM

## 2022-08-03 DIAGNOSIS — R4689 Other symptoms and signs involving appearance and behavior: Secondary | ICD-10-CM | POA: Diagnosis present

## 2022-08-03 DIAGNOSIS — A419 Sepsis, unspecified organism: Principal | ICD-10-CM

## 2022-08-03 DIAGNOSIS — J9601 Acute respiratory failure with hypoxia: Secondary | ICD-10-CM | POA: Diagnosis present

## 2022-08-03 DIAGNOSIS — N179 Acute kidney failure, unspecified: Secondary | ICD-10-CM | POA: Diagnosis present

## 2022-08-03 DIAGNOSIS — F17210 Nicotine dependence, cigarettes, uncomplicated: Secondary | ICD-10-CM | POA: Diagnosis present

## 2022-08-03 DIAGNOSIS — Z880 Allergy status to penicillin: Secondary | ICD-10-CM

## 2022-08-03 DIAGNOSIS — S0990XA Unspecified injury of head, initial encounter: Secondary | ICD-10-CM | POA: Diagnosis present

## 2022-08-03 DIAGNOSIS — Z1152 Encounter for screening for COVID-19: Secondary | ICD-10-CM

## 2022-08-03 DIAGNOSIS — E872 Acidosis, unspecified: Secondary | ICD-10-CM | POA: Diagnosis present

## 2022-08-03 DIAGNOSIS — T50901A Poisoning by unspecified drugs, medicaments and biological substances, accidental (unintentional), initial encounter: Secondary | ICD-10-CM | POA: Diagnosis present

## 2022-08-03 DIAGNOSIS — B1920 Unspecified viral hepatitis C without hepatic coma: Secondary | ICD-10-CM | POA: Diagnosis present

## 2022-08-03 DIAGNOSIS — J45909 Unspecified asthma, uncomplicated: Secondary | ICD-10-CM | POA: Diagnosis present

## 2022-08-03 DIAGNOSIS — T50902A Poisoning by unspecified drugs, medicaments and biological substances, intentional self-harm, initial encounter: Principal | ICD-10-CM | POA: Diagnosis present

## 2022-08-03 DIAGNOSIS — F1410 Cocaine abuse, uncomplicated: Secondary | ICD-10-CM | POA: Diagnosis present

## 2022-08-03 DIAGNOSIS — F1510 Other stimulant abuse, uncomplicated: Secondary | ICD-10-CM | POA: Diagnosis present

## 2022-08-03 DIAGNOSIS — Z91030 Bee allergy status: Secondary | ICD-10-CM

## 2022-08-03 DIAGNOSIS — F1910 Other psychoactive substance abuse, uncomplicated: Secondary | ICD-10-CM

## 2022-08-03 DIAGNOSIS — F1110 Opioid abuse, uncomplicated: Secondary | ICD-10-CM | POA: Diagnosis present

## 2022-08-03 DIAGNOSIS — X58XXXA Exposure to other specified factors, initial encounter: Secondary | ICD-10-CM | POA: Diagnosis present

## 2022-08-03 DIAGNOSIS — L03116 Cellulitis of left lower limb: Secondary | ICD-10-CM

## 2022-08-03 DIAGNOSIS — E162 Hypoglycemia, unspecified: Secondary | ICD-10-CM | POA: Diagnosis present

## 2022-08-03 DIAGNOSIS — F32A Depression, unspecified: Secondary | ICD-10-CM | POA: Diagnosis present

## 2022-08-03 DIAGNOSIS — Z781 Physical restraint status: Secondary | ICD-10-CM

## 2022-08-03 DIAGNOSIS — G928 Other toxic encephalopathy: Secondary | ICD-10-CM | POA: Diagnosis present

## 2022-08-03 DIAGNOSIS — R569 Unspecified convulsions: Secondary | ICD-10-CM | POA: Diagnosis present

## 2022-08-03 HISTORY — DX: Inflammatory liver disease, unspecified: K75.9

## 2022-08-03 LAB — CBC
HCT: 39.9 % (ref 39.0–52.0)
Hemoglobin: 13.4 g/dL (ref 13.0–17.0)
MCH: 30 pg (ref 26.0–34.0)
MCHC: 33.6 g/dL (ref 30.0–36.0)
MCV: 89.3 fL (ref 80.0–100.0)
Platelets: 382 10*3/uL (ref 150–400)
RBC: 4.47 MIL/uL (ref 4.22–5.81)
RDW: 12.3 % (ref 11.5–15.5)
WBC: 33.6 10*3/uL — ABNORMAL HIGH (ref 4.0–10.5)
nRBC: 0 % (ref 0.0–0.2)

## 2022-08-03 LAB — COMPREHENSIVE METABOLIC PANEL
ALT: 47 U/L — ABNORMAL HIGH (ref 0–44)
AST: 71 U/L — ABNORMAL HIGH (ref 15–41)
Albumin: 4.6 g/dL (ref 3.5–5.0)
Alkaline Phosphatase: 77 U/L (ref 38–126)
Anion gap: 30 — ABNORMAL HIGH (ref 5–15)
BUN: 16 mg/dL (ref 6–20)
CO2: 10 mmol/L — ABNORMAL LOW (ref 22–32)
Calcium: 9.7 mg/dL (ref 8.9–10.3)
Chloride: 94 mmol/L — ABNORMAL LOW (ref 98–111)
Creatinine, Ser: 1.89 mg/dL — ABNORMAL HIGH (ref 0.61–1.24)
GFR, Estimated: 50 mL/min — ABNORMAL LOW (ref >60)
Glucose, Bld: 61 mg/dL — ABNORMAL LOW (ref 70–99)
Potassium: 3.9 mmol/L (ref 3.5–5.1)
Sodium: 134 mmol/L — ABNORMAL LOW (ref 135–145)
Total Bilirubin: 1.9 mg/dL — ABNORMAL HIGH (ref 0.3–1.2)
Total Protein: 8.5 g/dL — ABNORMAL HIGH (ref 6.5–8.1)

## 2022-08-03 LAB — ETHANOL: Alcohol, Ethyl (B): <10 mg/dL (ref <10)

## 2022-08-03 LAB — SALICYLATE LEVEL: Salicylate Lvl: <7 mg/dL — ABNORMAL LOW (ref 7.0–30.0)

## 2022-08-03 LAB — ACETAMINOPHEN LEVEL: Acetaminophen (Tylenol), Serum: <10 ug/mL — ABNORMAL LOW (ref 10–30)

## 2022-08-03 MED ORDER — LORAZEPAM 2 MG/ML IJ SOLN
2.0000 mg | Freq: Once | INTRAMUSCULAR | Status: AC
  Administered 2022-08-03: 2 mg via INTRAMUSCULAR
  Filled 2022-08-03: qty 1

## 2022-08-03 MED ORDER — SODIUM CHLORIDE 0.9 % IV SOLN
2.0000 g | Freq: Once | INTRAVENOUS | Status: AC
  Administered 2022-08-04: 2 g via INTRAVENOUS
  Filled 2022-08-03: qty 12.5

## 2022-08-03 MED ORDER — METRONIDAZOLE 500 MG/100ML IV SOLN
500.0000 mg | Freq: Once | INTRAVENOUS | Status: AC
  Administered 2022-08-04: 500 mg via INTRAVENOUS
  Filled 2022-08-03: qty 100

## 2022-08-03 MED ORDER — LACTATED RINGERS IV BOLUS (SEPSIS)
1000.0000 mL | Freq: Once | INTRAVENOUS | Status: AC
  Administered 2022-08-04: 1000 mL via INTRAVENOUS

## 2022-08-03 MED ORDER — VANCOMYCIN HCL IN DEXTROSE 1-5 GM/200ML-% IV SOLN
1000.0000 mg | Freq: Once | INTRAVENOUS | Status: AC
  Administered 2022-08-04: 1000 mg via INTRAVENOUS
  Filled 2022-08-03: qty 200

## 2022-08-03 MED ORDER — DIPHENHYDRAMINE HCL 50 MG/ML IJ SOLN
50.0000 mg | Freq: Once | INTRAMUSCULAR | Status: AC
  Administered 2022-08-03: 50 mg via INTRAMUSCULAR

## 2022-08-03 MED ORDER — DIPHENHYDRAMINE HCL 50 MG/ML IJ SOLN
50.0000 mg | Freq: Once | INTRAMUSCULAR | Status: DC
  Filled 2022-08-03: qty 1

## 2022-08-03 MED ORDER — LACTATED RINGERS IV SOLN: INTRAVENOUS | Status: AC

## 2022-08-03 MED ORDER — MIDAZOLAM HCL 2 MG/2ML IJ SOLN: 2.0000 mg | Freq: Once | INTRAMUSCULAR | Status: DC

## 2022-08-03 NOTE — ED Provider Notes (Signed)
[facility] Provider Note    Event Date/Time   First MD Initiated Contact with Patient 08/03/22 2230     (approximate)  History   Chief Complaint: Altered mental status, agitation  HPI  Brian Decker. is a 27 y.o. male with a past medical history of polysubstance abuse presents to the emergency department with EMS for altered mental status acute agitation and head injury.  According to EMS they were called out to a hotel for a patient acting erratic and extremely agitated in the lobby.  They found the patient to be very agitated and forcibly hitting his head on the ground.  EMS gave 2 mg of IM Versed 5 mg of IM Haldol brought the patient to the emergency department with the help of police to help restrain the patient.  Upon arrival patient is actively being restrained by multiple police officers.  He is intermittently somnolent but then wakes up thrashing kicking and thrashing his head around hitting his head forcibly on the stretcher.  Patient is not answering any questions not following any commands.  Patient appears quite agitated and altered.  We will attempt to obtain IV access so we can further sedate the patient as I do believe he will require a head CT and medical workup.  Patient will require restraints as well.  Physical Exam   Triage Vital Signs: ED Triage Vitals  Enc Vitals Group     BP      Pulse      Resp      Temp      Temp src      SpO2      Weight      Height      Head Circumference      Peak Flow      Pain Score      Pain Loc      Pain Edu?      Excl. in [place]?     Most recent vital signs: There were no vitals filed for this visit.  General: Highly agitated, kicking and hitting on the bed forcibly hitting his head on the bed requiring multiple people to restrain the patient.  Intermittently he will almost fall asleep and then shortly after wakes up very agitated and aggressive. CV:  Good peripheral perfusion.  Regular rhythm rate  around 120 bpm Resp:  Mild to moderate tachypnea.  Yelling at times. Abd:  No distention.   Other:  Patient has erythema and swelling of the left leg around the tibia where there is an actively oozing wound likely infection.  Patient has several wounds throughout his upper and lower extremities.   ED Results / Procedures / Treatments   EKG viewed and interpreted by myself shows sinus tachycardia 131 bpm with a narrow QRS, normal axis, normal intervals, nonspecific but no concerning ST changes.  MEDICATIONS ORDERED IN ED: Medications - No data to display   IMPRESSION / MDM / [place] / ED COURSE  I reviewed the triage vital signs and the nursing notes.  Patient's presentation is most consistent with acute presentation with potential threat to life or bodily function.  Patient presents to the emergency department with extreme agitation and aggression forcibly hitting his head.  Has received 5 mg of Haldol and 2 mg of IM Versed prior to arrival.  Will attempt to obtain IV access so we can dose further sedation as the patient is currently security and 3 police officers to hold the patient  down.  Will attempt to place the patient in restraints for his own safety as well as the safety of our staff members while we worked the patient up.  Given the report of significant head injury as well as hitting his head multiple times of the stretcher forcibly in the emergency department will obtain CT imaging of the head.  We will check labs IV hydrate and continue to sedate as necessary to allow the patient to metabolize what ever substances currently in his body.  I reviewed the patient's records he was just seen here 1 week ago for similar substance abuse issue, again with significant aggression requiring restraints and sedation at that time as well.  Unable to obtain an IV at this time due to significant agitation we will dose 50 mg of IM Benadryl as well as 2 mg of IM Ativan.  Patient's vital  signs have resulted showing a temperature of 101.8 he is tachycardic to 122 and tachypneic to 25.  White blood cell count has resulted at 33,000 likely indicating sepsis.  Patient has a large swollen erythematous area into his left leg which could very likely be the source most consistent with cellulitis versus abscess.  We will check blood cultures lactic acid continue to IV hydrate start the patient on broad-spectrum antibiotics.  ----------------------------------------- 10:38 PM on 08/03/2022 -----------------------------------------   Behavioral Restraint Provider Note:  Behavioral Indicators: Danger to self, Danger to others, and Violent behavior  Reaction to intervention: resisting  Review of systems: No changes  History: History and Physical reviewed and Drugs and Medications reviewed  Mental Status Exam: Agitated and aggressive.  Restraint Continuation: Continue  Restraint Rationale Continuation: Remains agitated and aggressive   CRITICAL CARE Performed by: Harvest Dark   Total critical care time: 30 minutes  Critical care time was exclusive of separately billable procedures and treating other patients.  Critical care was necessary to treat or prevent imminent or life-threatening deterioration.  Critical care was time spent personally by me on the following activities: development of treatment plan with patient and/or surrogate as well as nursing, discussions with consultants, evaluation of patient's response to treatment, examination of patient, obtaining history from patient or surrogate, ordering and performing treatments and interventions, ordering and review of laboratory studies, ordering and review of radiographic studies, pulse oximetry and re-evaluation of patient's condition.    FINAL CLINICAL IMPRESSION(S) / ED DIAGNOSES   Agitation Aggression Polysubstance abuse   Note:  This document was prepared using Dragon voice recognition software  and may include unintentional dictation errors.   Harvest Dark, MD 08/03/22 [phone]

## 2022-08-03 NOTE — ED Notes (Signed)
Pt arrived combative. Pt yelling, spitting. Pt in handcuffs from [place] [employer]. Pt placed in violent restraints. [place] [employer] bedside.

## 2022-08-03 NOTE — Sepsis Progress Note (Signed)
Elink following for Sepsis Protocol 

## 2022-08-03 NOTE — ED Triage Notes (Signed)
Per EMS call from motel about pt "acting out." When EMS arrived pt was combative and aggressive. In route pt given 5mg  Versed, 2 haldol.   EMS VS HR 134 BP 106/83

## 2022-08-03 NOTE — ED Provider Notes (Signed)
11:25 PM  Assumed care at shift change.  Pt is a 27yo M with h/o IVDA who presents to ED with AMS, combative behavior requiring sedation and restraints.  Here is febrile, tachycardic, has leukocytosis 33,000.  Left leg appears swollen and cellulitis.  Labs, imaging pending.  Getting septic workup.  Under IVC.  Will need admission.  1:28 AM Attempted to take patient out of restraints and he became increasingly combative and not following commands.  Will not open eyes, answer questions.  GCS currently 7.  I am concerned if we continue to give him sedation without airway protection that this could be a very dangerous situation for him.  I feel that he needs intubation so that we can sedate him properly so that he can get the appropriate care that he desperately needs given he is critically ill with severe sepsis.  Intubated at bedside using etomidate, succinylcholine and sedated with propofol and Versed.   2:23 AM  Discussed with Rufina Falco, NP with critical care for admission.  3:47 AM  Pt in ICU.  CT head and left lower extremity reviewed and interpreted by myself and the radiologist.  CT head shows no acute abnormality.  CT of the left leg shows cellulitis but no abscess or soft tissue emphysema.  Procedure Name: Intubation Date/Time: 08/04/2022 1:30 AM  Performed by: Javayah Magaw, Delice Bison, DOPre-anesthesia Checklist: Patient identified, Patient being monitored, Timeout performed, Emergency Drugs available and Suction available Oxygen Delivery Method: Ambu bag Preoxygenation: Pre-oxygenation with 100% oxygen Induction Type: Rapid sequence Ventilation: Mask ventilation without difficulty Laryngoscope Size: Glidescope and 3 Grade View: Grade I Tube size: 7.5 mm Number of attempts: 1 Placement Confirmation: ETT inserted through vocal cords under direct vision, CO2 detector, Breath sounds checked- equal and bilateral and Positive ETCO2 Secured at: 23 cm Tube secured with: ETT holder Dental Injury:  Teeth and Oropharynx as per pre-operative assessment     CRITICAL CARE Performed by: Pryor Curia   Total critical care time: 40 minutes  Critical care time was exclusive of separately billable procedures and treating other patients.  Critical care was necessary to treat or prevent imminent or life-threatening deterioration.  Critical care was time spent personally by me on the following activities: development of treatment plan with patient and/or surrogate as well as nursing, discussions with consultants, evaluation of patient's response to treatment, examination of patient, obtaining history from patient or surrogate, ordering and performing treatments and interventions, ordering and review of laboratory studies, ordering and review of radiographic studies, pulse oximetry and re-evaluation of patient's condition.        Rihanna Marseille, Delice Bison, DO 08/04/22 [phone]

## 2022-08-03 NOTE — Progress Notes (Signed)
CODE SEPSIS - PHARMACY COMMUNICATION  **Broad Spectrum Antibiotics should be administered within 1 hour of Sepsis diagnosis**  Time Code Sepsis Called/Page Received: 2307  Antibiotics Ordered: Cefepime & Vancomycin  Time of 1st antibiotic administration: 0036, pt combative requiring sedation prior to getting IV access to start Abx.  Renda Rolls, PharmD, [facility] 08/03/2022 11:09 PM

## 2022-08-04 ENCOUNTER — Inpatient Hospital Stay (HOSPITAL_COMMUNITY)
Admit: 2022-08-04 | Discharge: 2022-08-04 | Disposition: A | Discharge status: Home / Self Care | Payer: Medicaid Other | Attending: Nurse Practitioner | Admitting: Nurse Practitioner

## 2022-08-04 ENCOUNTER — Emergency Department: Payer: Medicaid Other

## 2022-08-04 ENCOUNTER — Inpatient Hospital Stay: Payer: Medicaid Other

## 2022-08-04 DIAGNOSIS — I38 Endocarditis, valve unspecified: Secondary | ICD-10-CM

## 2022-08-04 DIAGNOSIS — T50901A Poisoning by unspecified drugs, medicaments and biological substances, accidental (unintentional), initial encounter: Secondary | ICD-10-CM | POA: Diagnosis present

## 2022-08-04 LAB — BLOOD GAS, VENOUS
Acid-Base Excess: 0.3 mmol/L (ref 0.0–2.0)
Bicarbonate: 26 mmol/L (ref 20.0–28.0)
O2 Saturation: 100 %
Patient temperature: 37
pCO2, Ven: 45 mmHg (ref 44–60)
pH, Ven: 7.37 (ref 7.25–7.43)
pO2, Ven: 119 mmHg — ABNORMAL HIGH (ref 32–45)

## 2022-08-04 LAB — CK: Total CK: 651 U/L — ABNORMAL HIGH (ref 49–397)

## 2022-08-04 LAB — LACTIC ACID, PLASMA
Lactic Acid, Venous: 4.6 mmol/L (ref 0.5–1.9)
Lactic Acid, Venous: 1.3 mmol/L (ref 0.5–1.9)

## 2022-08-04 LAB — COMPREHENSIVE METABOLIC PANEL
ALT: 39 U/L (ref 0–44)
AST: 62 U/L — ABNORMAL HIGH (ref 15–41)
Albumin: 4.1 g/dL (ref 3.5–5.0)
Alkaline Phosphatase: 57 U/L (ref 38–126)
Anion gap: 10 (ref 5–15)
BUN: 15 mg/dL (ref 6–20)
CO2: 25 mmol/L (ref 22–32)
Calcium: 8.7 mg/dL — ABNORMAL LOW (ref 8.9–10.3)
Chloride: 97 mmol/L — ABNORMAL LOW (ref 98–111)
Creatinine, Ser: 1.23 mg/dL (ref 0.61–1.24)
GFR, Estimated: >60 mL/min (ref >60)
Glucose, Bld: 114 mg/dL — ABNORMAL HIGH (ref 70–99)
Potassium: 3.8 mmol/L (ref 3.5–5.1)
Sodium: 132 mmol/L — ABNORMAL LOW (ref 135–145)
Total Bilirubin: 2 mg/dL — ABNORMAL HIGH (ref 0.3–1.2)
Total Protein: 6.9 g/dL (ref 6.5–8.1)

## 2022-08-04 LAB — URINALYSIS, COMPLETE (UACMP) WITH MICROSCOPIC
Bilirubin Urine: NEGATIVE
Glucose, UA: NEGATIVE mg/dL
Ketones, ur: NEGATIVE mg/dL
Leukocytes,Ua: NEGATIVE
Nitrite: NEGATIVE
Protein, ur: 100 mg/dL — AB
Specific Gravity, Urine: 1.018 (ref 1.005–1.030)
pH: 5 (ref 5.0–8.0)

## 2022-08-04 LAB — RESP PANEL BY RT-PCR (RSV, FLU A&B, COVID)  RVPGX2
Influenza A by PCR: NEGATIVE
Influenza B by PCR: NEGATIVE
Resp Syncytial Virus by PCR: NEGATIVE
SARS Coronavirus 2 by RT PCR: NEGATIVE

## 2022-08-04 LAB — BLOOD GAS, ARTERIAL
Acid-Base Excess: 0.3 mmol/L (ref 0.0–2.0)
Bicarbonate: 25.4 mmol/L (ref 20.0–28.0)
FIO2: 28 %
MECHVT: 450 mL
O2 Saturation: 100 %
PEEP: 5 cmH2O
Patient temperature: 37
RATE: 18 resp/min
pCO2 arterial: 42 mmHg (ref 32–48)
pH, Arterial: 7.39 (ref 7.35–7.45)
pO2, Arterial: 145 mmHg — ABNORMAL HIGH (ref 83–108)

## 2022-08-04 LAB — ECHOCARDIOGRAM COMPLETE
AR max vel: 2.77 cm2
AV Area VTI: 2.82 cm2
AV Area mean vel: 2.59 cm2
AV Mean grad: 4 mmHg
AV Peak grad: 6.6 mmHg
Ao pk vel: 1.28 m/s
Area-P 1/2: 4.15 cm2
S' Lateral: 2.7 cm

## 2022-08-04 LAB — URINE DRUG SCREEN, QUALITATIVE (ARMC ONLY)
Amphetamines, Ur Screen: POSITIVE — AB
Barbiturates, Ur Screen: NOT DETECTED
Benzodiazepine, Ur Scrn: POSITIVE — AB
Cannabinoid 50 Ng, Ur ~~LOC~~: POSITIVE — AB
Cocaine Metabolite,Ur ~~LOC~~: POSITIVE — AB
MDMA (Ecstasy)Ur Screen: NOT DETECTED
Methadone Scn, Ur: NOT DETECTED
Opiate, Ur Screen: NOT DETECTED
Phencyclidine (PCP) Ur S: NOT DETECTED
Tricyclic, Ur Screen: NOT DETECTED

## 2022-08-04 LAB — MAGNESIUM: Magnesium: 2.1 mg/dL (ref 1.7–2.4)

## 2022-08-04 LAB — PROCALCITONIN: Procalcitonin: 1.1 ng/mL

## 2022-08-04 LAB — APTT: aPTT: 38 seconds — ABNORMAL HIGH (ref 24–36)

## 2022-08-04 LAB — HIV ANTIBODY (ROUTINE TESTING W REFLEX): HIV Screen 4th Generation wRfx: NONREACTIVE

## 2022-08-04 LAB — CBG MONITORING, ED: Glucose-Capillary: 85 mg/dL (ref 70–99)

## 2022-08-04 LAB — CBC
HCT: 34.4 % — ABNORMAL LOW (ref 39.0–52.0)
Hemoglobin: 12.2 g/dL — ABNORMAL LOW (ref 13.0–17.0)
MCH: 29.9 pg (ref 26.0–34.0)
MCHC: 35.5 g/dL (ref 30.0–36.0)
MCV: 84.3 fL (ref 80.0–100.0)
Platelets: 275 10*3/uL (ref 150–400)
RBC: 4.08 MIL/uL — ABNORMAL LOW (ref 4.22–5.81)
RDW: 12.5 % (ref 11.5–15.5)
WBC: 21.8 10*3/uL — ABNORMAL HIGH (ref 4.0–10.5)
nRBC: 0 % (ref 0.0–0.2)

## 2022-08-04 LAB — PROTIME-INR
INR: 1.3 — ABNORMAL HIGH (ref 0.8–1.2)
Prothrombin Time: 15.9 seconds — ABNORMAL HIGH (ref 11.4–15.2)

## 2022-08-04 LAB — TROPONIN I (HIGH SENSITIVITY): Troponin I (High Sensitivity): 10 ng/L (ref <18)

## 2022-08-04 LAB — GLUCOSE, CAPILLARY
Glucose-Capillary: 107 mg/dL — ABNORMAL HIGH (ref 70–99)
Glucose-Capillary: 73 mg/dL (ref 70–99)
Glucose-Capillary: 74 mg/dL (ref 70–99)
Glucose-Capillary: 87 mg/dL (ref 70–99)
Glucose-Capillary: 98 mg/dL (ref 70–99)

## 2022-08-04 LAB — PHOSPHORUS: Phosphorus: 3.6 mg/dL (ref 2.5–4.6)

## 2022-08-04 MED ORDER — ACETAMINOPHEN 325 MG PO TABS
650.0000 mg | ORAL_TABLET | Freq: Four times a day (QID) | ORAL | Status: DC | PRN
  Administered 2022-08-04 – 2022-08-05 (×3): 650 mg
  Filled 2022-08-04 (×3): qty 2

## 2022-08-04 MED ORDER — PROPOFOL 1000 MG/100ML IV EMUL
INTRAVENOUS | Status: AC
  Administered 2022-08-04: 20 ug/kg/min via INTRAVENOUS
  Filled 2022-08-04: qty 100

## 2022-08-04 MED ORDER — FENTANYL CITRATE PF 50 MCG/ML IJ SOSY: 100.0000 ug | PREFILLED_SYRINGE | Freq: Once | INTRAMUSCULAR | Status: DC

## 2022-08-04 MED ORDER — FENTANYL CITRATE PF 50 MCG/ML IJ SOSY
PREFILLED_SYRINGE | INTRAMUSCULAR | Status: AC
  Administered 2022-08-04: 100 ug via INTRAVENOUS
  Filled 2022-08-04: qty 2

## 2022-08-04 MED ORDER — LACTATED RINGERS IV BOLUS
1000.0000 mL | Freq: Once | INTRAVENOUS | Status: AC
  Administered 2022-08-04: 1000 mL via INTRAVENOUS

## 2022-08-04 MED ORDER — FENTANYL BOLUS VIA INFUSION: 50.0000 ug | INTRAVENOUS | Status: DC | PRN

## 2022-08-04 MED ORDER — PROPOFOL 1000 MG/100ML IV EMUL
5.0000 ug/kg/min | INTRAVENOUS | Status: DC
  Administered 2022-08-04 – 2022-08-05 (×5): 40 ug/kg/min via INTRAVENOUS
  Filled 2022-08-04 (×5): qty 100

## 2022-08-04 MED ORDER — MIDAZOLAM-SODIUM CHLORIDE 100-0.9 MG/100ML-% IV SOLN
0.5000 mg/h | INTRAVENOUS | Status: DC
  Administered 2022-08-04: 2 mg/h via INTRAVENOUS
  Administered 2022-08-05: 4 mg/h via INTRAVENOUS
  Filled 2022-08-04 (×2): qty 100

## 2022-08-04 MED ORDER — SUCCINYLCHOLINE CHLORIDE 200 MG/10ML IV SOSY
PREFILLED_SYRINGE | INTRAVENOUS | Status: AC
  Administered 2022-08-04: 80 mg via INTRAVENOUS
  Filled 2022-08-04: qty 10

## 2022-08-04 MED ORDER — ETOMIDATE 2 MG/ML IV SOLN
INTRAVENOUS | Status: AC
  Filled 2022-08-04: qty 10

## 2022-08-04 MED ORDER — VITAL 1.5 CAL PO LIQD
1000.0000 mL | ORAL | Status: DC
  Administered 2022-08-04: 1000 mL

## 2022-08-04 MED ORDER — ETOMIDATE 2 MG/ML IV SOLN
INTRAVENOUS | Status: AC
  Administered 2022-08-04: 30 mg via INTRAVENOUS
  Filled 2022-08-04: qty 10

## 2022-08-04 MED ORDER — MIDAZOLAM BOLUS VIA INFUSION
4.0000 mg | Freq: Once | INTRAVENOUS | Status: AC
  Administered 2022-08-04: 4 mg via INTRAVENOUS
  Filled 2022-08-04: qty 4

## 2022-08-04 MED ORDER — IOHEXOL 300 MG/ML  SOLN
100.0000 mL | Freq: Once | INTRAMUSCULAR | Status: AC | PRN
  Administered 2022-08-04: 100 mL via INTRAVENOUS

## 2022-08-04 MED ORDER — DOCUSATE SODIUM 50 MG/5ML PO LIQD
100.0000 mg | Freq: Two times a day (BID) | ORAL | Status: DC
  Administered 2022-08-04 – 2022-08-05 (×3): 100 mg
  Filled 2022-08-04 (×4): qty 10

## 2022-08-04 MED ORDER — ACETAMINOPHEN 650 MG RE SUPP
650.0000 mg | Freq: Once | RECTAL | Status: DC
  Filled 2022-08-04: qty 2

## 2022-08-04 MED ORDER — ENOXAPARIN SODIUM 40 MG/0.4ML IJ SOSY
40.0000 mg | PREFILLED_SYRINGE | INTRAMUSCULAR | Status: DC
  Administered 2022-08-04 – 2022-08-06 (×3): 40 mg via SUBCUTANEOUS
  Filled 2022-08-04 (×3): qty 0.4

## 2022-08-04 MED ORDER — POLYETHYLENE GLYCOL 3350 17 G PO PACK
17.0000 g | PACK | Freq: Every day | ORAL | Status: DC
  Administered 2022-08-04 – 2022-08-05 (×2): 17 g
  Filled 2022-08-04 (×2): qty 1

## 2022-08-04 MED ORDER — POLYETHYLENE GLYCOL 3350 17 G PO PACK: 17.0000 g | PACK | Freq: Every day | ORAL | Status: DC | PRN

## 2022-08-04 MED ORDER — FENTANYL 2500MCG IN NS 250ML (10MCG/ML) PREMIX INFUSION
0.0000 ug/h | INTRAVENOUS | Status: DC
  Administered 2022-08-04: 25 ug/h via INTRAVENOUS
  Administered 2022-08-04: 200 ug/h via INTRAVENOUS
  Administered 2022-08-05: 175 ug/h via INTRAVENOUS
  Filled 2022-08-04 (×3): qty 250

## 2022-08-04 MED ORDER — ETOMIDATE 2 MG/ML IV SOLN: 30.0000 mg | Freq: Once | INTRAVENOUS | Status: AC

## 2022-08-04 MED ORDER — METRONIDAZOLE 500 MG/100ML IV SOLN
500.0000 mg | Freq: Two times a day (BID) | INTRAVENOUS | Status: DC
  Administered 2022-08-04 – 2022-08-05 (×3): 500 mg via INTRAVENOUS
  Filled 2022-08-04 (×3): qty 100

## 2022-08-04 MED ORDER — DOCUSATE SODIUM 50 MG/5ML PO LIQD: 100.0000 mg | Freq: Two times a day (BID) | ORAL | Status: DC | PRN

## 2022-08-04 MED ORDER — SODIUM CHLORIDE 0.9 % IV SOLN
2.0000 g | Freq: Three times a day (TID) | INTRAVENOUS | Status: DC
  Administered 2022-08-04 – 2022-08-05 (×4): 2 g via INTRAVENOUS
  Filled 2022-08-04: qty 12.5
  Filled 2022-08-04 (×3): qty 2
  Filled 2022-08-04: qty 12.5

## 2022-08-04 MED ORDER — JUVEN PO PACK
1.0000 | PACK | Freq: Two times a day (BID) | ORAL | Status: DC
  Administered 2022-08-05: 1

## 2022-08-04 MED ORDER — FAMOTIDINE 20 MG PO TABS
20.0000 mg | ORAL_TABLET | Freq: Two times a day (BID) | ORAL | Status: DC
  Administered 2022-08-04 – 2022-08-05 (×4): 20 mg
  Filled 2022-08-04 (×4): qty 1

## 2022-08-04 MED ORDER — CHLORHEXIDINE GLUCONATE CLOTH 2 % EX PADS
6.0000 | MEDICATED_PAD | Freq: Every day | CUTANEOUS | Status: DC
  Administered 2022-08-04 – 2022-08-05 (×2): 6 via TOPICAL

## 2022-08-04 MED ORDER — VANCOMYCIN HCL 750 MG/150ML IV SOLN
750.0000 mg | Freq: Two times a day (BID) | INTRAVENOUS | Status: DC
  Administered 2022-08-04 – 2022-08-05 (×3): 750 mg via INTRAVENOUS
  Filled 2022-08-04 (×3): qty 150

## 2022-08-04 MED ORDER — MIDAZOLAM HCL 2 MG/2ML IJ SOLN: 2.0000 mg | INTRAMUSCULAR | Status: DC | PRN

## 2022-08-04 MED ORDER — FENTANYL CITRATE PF 50 MCG/ML IJ SOSY: 100.0000 ug | PREFILLED_SYRINGE | Freq: Once | INTRAMUSCULAR | Status: AC

## 2022-08-04 MED ORDER — ORAL CARE MOUTH RINSE
15.0000 mL | OROMUCOSAL | Status: DC
  Administered 2022-08-04 – 2022-08-05 (×15): 15 mL via OROMUCOSAL

## 2022-08-04 MED ORDER — FREE WATER
30.0000 mL | Status: DC
  Administered 2022-08-04 – 2022-08-05 (×6): 30 mL

## 2022-08-04 MED ORDER — SUCCINYLCHOLINE CHLORIDE 200 MG/10ML IV SOSY: 80.0000 mg | PREFILLED_SYRINGE | Freq: Once | INTRAVENOUS | Status: AC

## 2022-08-04 MED ORDER — ORAL CARE MOUTH RINSE: 15.0000 mL | OROMUCOSAL | Status: DC | PRN

## 2022-08-04 NOTE — H&P (Addendum)
NAME:  Brian Cotten., MRN:  [id number], DOB:  July 25, 1995, LOS: 0 ADMISSION DATE:  08/03/2022, CONSULTATION DATE: 08/04/2022 REFERRING MD: Gena Fray, CHIEF COMPLAINT: Altered mental status   HPI  27 y.o male with significant PMH of polysubstance abuse including cocaine, methamphetamine, heroin, and marijuana with multiple admissions for drug overdose, asthma, cellulitis who presented to the ED with chief complaints of altered mental status, and severe agitation.  Per ED report, EMS was called out to hotel due to patient acting erratic and extremely agitated in the lobby.  On EMS arrival patient was found to be very agitated and forcibly hitting his head on the ground.   combative, thrashing and kicking while he was actively being restrained by multiple police officers.He received 2 mg of IM Versed, and 5 mg of IM Haldol and was transported to the ED   ED Course: Initial vital signs showed HR of 134 beats/minute, BP 151/141mm Hg, the RR 25 breaths/minute, and the oxygen saturation 100% on  RA and a temperature of 101.93F (38.8C). Patient was combative, yelling and spitting at staff.  Patient arrived in handcuffs and was immediately placed in violent restraints and placed under IVC.  Patient became increasingly combative and difficult to reorient.  Due to concerns for possibly needing multiple sedatives patient was intubated for airway protection and need for adequate sedation.  Chemistry:Na+/ K+: 130/3.9 glucose: 61 BUN/Cr.:  16/1.89  AST/ALT: 71/47 CBC: WBC: 33.6 Other Lab findings:   PCT: 1.1 lactic acid: 4.6 COVID PCR: Negative, CO2: 10/ Anion gap: 30, CK: 651 Arterial Blood Gas result:  pO2 119; pCO2 45; pH 7.37;  HCO3 26, %O2 Sat 100.  Imaging:  CXR> no active cardiopulmonary process CTH> negative CT LLE: Cellulitis, no abscess or osteomyelitis, no fracture or dislocation  Patient given 30 cc/kg of fluids and started on broad-spectrum antibiotics Vanco cefepime and Flagyl for sepsis  secondary to suspected cellulitis. PCCM consulted.  Past Medical History  polysubstance abuse including cocaine, methamphetamine, heroin, and marijuana with multiple admissions for drug overdose, asthma, cellulitis   Significant Hospital Events   1/15: Admitted to ICU with altered mental status in the setting of drug overdose   Consults:  Psychiatry  Procedures:  1/15: Intubation  Significant Diagnostic Tests:  1/14: Chest Xray> no acute cardiopulmonary process 1/15: Noncontrast CT head> no acute intracranial abnormality 1/15: CT LLE>IMPRESSION: 1. Dermal thickening with associated subcutaneus soft tissue edema of the anterolateral leg. Correlate with signs and symptoms of cellulitis. No subcutaneus soft tissue emphysema; however, please note necrotizing fasciitis cannot be excluded as this is a clinical diagnosis. 2. No abscess formation. 3. No CT finding of osteomyelitis. 4.  No acute displaced fracture or dislocation.  Micro Data:  1/14: SARS-CoV-2 PCR> negative 1/14: Influenza PCR> negative 1/14: Blood culture x2> 1/14: Urine Culture> 1/14: MRSA PCR>>   Antimicrobials:  Vancomycin 1/14> Cefepime 1/14 > Metronidazole 1/14>  OBJECTIVE  Blood pressure 125/81, pulse (!) 101, temperature (!) 97.3 F (36.3 C), resp. rate (!) 23, SpO2 100 %.    Vent Mode: PRVC FiO2 (%):  [28 %] 28 % Set Rate:  [18 bmp] 18 bmp Vt Set:  [450 mL] 450 mL PEEP:  [5 cmH20] 5 cmH20 Plateau Pressure:  [20 cmH20] 20 cmH20   Intake/Output Summary (Last 24 hours) at 08/04/2022 0335 Last data filed at 08/04/2022 0300 Gross per 24 hour  Intake --  Output 1000 ml  Net -1000 ml   There were no vitals filed for this visit.  Physical Examination  GENERAL 52 : year-old critically ill patient lying in the bed intubated, mechanically ventilated and sedated EYES: Pupils equal, round, reactive to light and accommodation. No scleral icterus. Extraocular muscles intact.  HEENT: Head traumatic mild  facial bruising, normocephalic. Oropharynx and nasopharynx clear.  NECK:  Supple, no jugular venous distention. No thyroid enlargement, no tenderness.  LUNGS: Normal breath sounds bilaterally, no wheezing, rales,rhonchi or crepitation. No use of accessory muscles of respiration.  CARDIOVASCULAR: S1, S2 normal. No murmurs, rubs, or gallops.  ABDOMEN: Soft, nontender, nondistended. Bowel sounds present. No organomegaly or mass.  EXTREMITIES:swelling and erythema as noted below. UTA  Muscle strength. Capillary refill is > 3 seconds in all extremities. Pulses palpable.  NEUROLOGIC:The patient is intubated and sedated.  Withdraws all extremities to noxious stimuli. Cranial nerves are intact.   SKIN: Multiple skin lesions, abrasions and wounds the lower and upper extremity              Labs/imaging that I havepersonally reviewed  (right click and "Reselect all SmartList Selections" daily)     Labs   CBC: Recent Labs  Lab 08/03/22 2237  WBC 33.6*  HGB 13.4  HCT 39.9  MCV 89.3  PLT 147    Basic Metabolic Panel: Recent Labs  Lab 08/03/22 2237  NA 134*  K 3.9  CL 94*  CO2 10*  GLUCOSE 61*  BUN 16  CREATININE 1.89*  CALCIUM 9.7   GFR: Estimated Creatinine Clearance: 53.7 mL/min (A) (by C-G formula based on SCr of 1.89 mg/dL (H)). Recent Labs  Lab 08/03/22 2237 08/03/22 2253 08/03/22 2314  PROCALCITON  --  1.10  --   WBC 33.6*  --   --   LATICACIDVEN  --   --  4.6*    Liver Function Tests: Recent Labs  Lab 08/03/22 2237  AST 71*  ALT 47*  ALKPHOS 77  BILITOT 1.9*  PROT 8.5*  ALBUMIN 4.6   No results for input(s): "LIPASE", "AMYLASE" in the last 168 hours. No results for input(s): "AMMONIA" in the last 168 hours.  ABG    Component Value Date/Time   PHART 7.39 08/04/2022 0314   PCO2ART 42 08/04/2022 0314   PO2ART 145 (H) 08/04/2022 0314   HCO3 25.4 08/04/2022 0314   O2SAT 100 08/04/2022 0314     Coagulation Profile: Recent Labs  Lab  08/03/22 2314  INR 1.3*    Cardiac Enzymes: Recent Labs  Lab 08/03/22 2254  CKTOTAL 651*    HbA1C: No results found for: "HGBA1C"  CBG: Recent Labs  Lab 08/04/22 0025  GLUCAP 73    Review of Systems:   UNABLE TO OBTAIN PATIENT IS INTUBATED AND SEDATED  Past Medical History  He,  has a past medical history of Asthma, Cocaine abuse ([place]), Crack cocaine use, Heroin abuse ([place]) (04/07/2020), and Methamphetamine use ([place]) (04/07/2020).   Surgical History   History reviewed. No pertinent surgical history.   Social History   reports that he has been smoking cigarettes. He has been smoking an average of .5 packs per day. He has never used smokeless tobacco. He reports current alcohol use. He reports current drug use. Drugs: Marijuana, IV, Cocaine, and Methamphetamines.   Family History   His family history is not on file.   Allergies Allergies  Allergen Reactions   Bee Venom Anaphylaxis     Home Medications  Prior to Admission medications   Not on File    Scheduled Meds:  acetaminophen  650 mg Rectal Once  docusate  100 mg Per Tube BID   etomidate       famotidine  20 mg Per Tube BID   polyethylene glycol  17 g Per Tube Daily   Continuous Infusions:  fentaNYL infusion INTRAVENOUS 25 mcg/hr (08/04/22 0538)   lactated ringers Stopped (08/04/22 0347)   metronidazole     midazolam 2 mg/hr (08/04/22 0400)   propofol (DIPRIVAN) infusion 40 mcg/kg/min (08/04/22 0510)   PRN Meds:.docusate, etomidate, fentaNYL, midazolam, polyethylene glycol   Active Hospital Problem list     Assessment & Plan:   # Acute respiratory failure with hypoxia due to sedation from severe agitation  Intubated for airway protection  - Full vent support - Wean PEEP and FiO2 for sats greater than 90% - LTV V - Follow chest x-ray, ABG prn.  - VAP prevention protocol - Daily SAT and SBT when appropriate - prn fentanyl, versed for RASS -1    # Acute toxic metabolic encephalopathy #  Drug Overdose Background history of drug abuse with multiple episodes and admission for drug overdose. UDS positive for Cocaine, Amphetamine, and Marijuana. CTH negative.  - Sedation with Propofol and Versed as bove - will need sitter/ and psych consult after acute illness/ extubation  # Sepsis Source likely skin and soft tissue. Patient has multiple wounds and a large subsucutaneous soft tissue edema on the left anterolateral leg. CT concerning for cellulitis. Initial interventions/workup included: 3 L  LR & Cefepime/ Vancomycin/ Metronidazole in the ED. meets SIRS criteria: Heart Rate 134 beats/minute, Respiratory Rate 25 breaths/minute,Temperature 101.8 , WBC 33 - Supplemental oxygen as needed, to maintain SpO2 > 90% - F/u cultures, trend lactic/ PCT - Monitor WBC/ fever curve - IV antibiotics: cefepime & vancomycin  & Flagyl - IVF hydration as needed - Pressors for MAP goal >65 - Strict I/O's - Will obtain Echo given history of IV drug use    # AKI  # Anion gap metabolic acidosis with Lactic Acidosis -Trend Lactate - Monitor I&O's / urinary output - Follow BMP - Ensure adequate renal perfusion - Avoid nephrotoxic agents as able - Replace electrolytes as indicated   # Transaminitis Hx of polysubstance abuse ETOH negative -Check Hepatitis panel -Trend   # Hypoglycemia - CBG's q4;  -Target range of 140 to 180 - Follow ICU Hypo/Hyperglycemia protocol     Best practice:  Diet:  NPO Pain/Anxiety/Delirium protocol (if indicated): Yes (RASS goal -1) VAP protocol (if indicated): Yes DVT prophylaxis: LMWH GI prophylaxis: PPI Glucose control:  SSI No Central venous access:  N/A Arterial line:  N/A Foley:  Yes, and it is still needed Mobility:  bed rest  PT consulted: N/A Last date of multidisciplinary goals of care discussion [1/15] Code Status:  full code Disposition: ICU   = Goals of Care = Code Status Order: FULL  Primary Emergency Contact: apple,misty Wishes to  pursue full aggressive treatment and intervention options, including CPR and intubation, but goals of care will be addressed on going with family if that should become necessary.  Critical care time: 45 minutes       Webb Silversmith DNP, CCRN, FNP-C, AGACNP-BC Acute Care NP [facility] PCCM on call pager [phone]

## 2022-08-04 NOTE — Progress Notes (Signed)
[facility] for Electrolyte Monitoring and Replacement   Recent Labs: Potassium (mmol/L)  Date Value  08/04/2022 3.8  09/27/2013 3.4   Magnesium (mg/dL)  Date Value  08/04/2022 2.1   Calcium (mg/dL)  Date Value  08/04/2022 8.7 (L)   Calcium, Total (mg/dL)  Date Value  09/27/2013 8.9 (L)   Albumin (g/dL)  Date Value  08/04/2022 4.1  09/27/2013 4.7   Phosphorus (mg/dL)  Date Value  08/04/2022 3.6   Sodium (mmol/L)  Date Value  08/04/2022 132 (L)  09/27/2013 139     Assessment: 27 y/o male with h/o asthma and substance abuse who is admitted with OD, cellulitis, AMS and agitation.   Pt sedated and ventilated. OGT in place.  Tube feeds started 08/04/22   Goal of Therapy:  Electrolytes WNL  Plan:  ---no electrolyte replacement warranted for today ---recheck electrolytes in am  Dallie Piles ,PharmD Clinical Pharmacist 08/04/2022 7:08 AM

## 2022-08-04 NOTE — Progress Notes (Signed)
[facility] Progress Note Patient Name: Brian Decker. DOB: Oct 03, 1995 MRN: [id number]   Date of Service  08/04/2022  HPI/Events of Note  15 M hx of polysubstance abuse with multiple admissions for OD brought in due to altered sensorium with severe agitation while in a hotel lobby. He required multiple sedating medications and had to be intubated for airway protection. Head CT without acute findings. CXR ETT 7.7 cm above the carina and OGT with side port near the GE junction  eICU Interventions  Discussed with bedside RN. ETT has already been adjusted. OGT to be pushed in further     Intervention Category Evaluation Type: New Patient Evaluation  Judd Lien 08/04/2022, 4:09 AM

## 2022-08-04 NOTE — ED Notes (Signed)
ETT moved by RT to 25 at the lip

## 2022-08-04 NOTE — Progress Notes (Signed)
Initial Nutrition Assessment  DOCUMENTATION CODES:   Not applicable  INTERVENTION:   Vital 1.5@60ml /hr- Initiate at 17ml/hr and advance by 70ml/hr q 8 hours until goal rate is reached.   Free water flushes 14ml q4 hours to maintain tube patency   Regimen provides 2160kcal/day, 97g/day protein and 1246ml/day of free water.   Pt at high refeed risk; recommend monitor potassium, magnesium and phosphorus labs daily until stable  Daily weights  Juven Fruit Punch BID via tube, each serving provides 95kcal and 2.5g of protein (amino acids glutamine and arginine)  NUTRITION DIAGNOSIS:   Inadequate oral intake related to inability to eat (pt sedated and ventilated) as evidenced by NPO status.  GOAL:   Provide needs based on ASPEN/SCCM guidelines  MONITOR:   Vent status, Labs, Weight trends, TF tolerance, Skin, I & O's  REASON FOR ASSESSMENT:   Ventilator    ASSESSMENT:   27 y/o male with h/o asthma and substance abuse who is admitted with OD, cellulitis, AMS and agitation.  Pt sedated and ventilated. OGT in place. Will plan to initiate tube feeds today. Pt is at refeed risk. There is no recent weight history in chart to determine if any significant weight changes.   Medications reviewed and include: colace, lovenox, pepcid, miralax, cefepime, metronidazole, propofol, vancomycin   Labs reviewed: Na 132(L), K 3.8 wnl, P 3.6 wnl, Mg 2.1 wnl Wbc- 21.8(H) Cbgs- 107, 85 x 24 hrs   Patient is currently intubated on ventilator support MV: 9.0 L/min Temp (24hrs), Avg:97.6 F (36.4 C), Min:95.4 F (35.2 C), Max:101.8 F (38.8 C)  Propofol: 15.24 ml/hr- provides 402kcal/day   MAP- >53mmHg   UOP- 1434ml   NUTRITION - FOCUSED PHYSICAL EXAM:  Flowsheet Row Most Recent Value  Orbital Region No depletion  Upper Arm Region No depletion  Thoracic and Lumbar Region No depletion  Buccal Region No depletion  Temple Region Mild depletion  Clavicle Bone Region Mild depletion   Clavicle and Acromion Bone Region Mild depletion  Scapular Bone Region No depletion  Dorsal Hand Unable to assess  Patellar Region No depletion  Anterior Thigh Region No depletion  Posterior Calf Region No depletion  Edema (RD Assessment) None  Hair Reviewed  Eyes Reviewed  Mouth Reviewed  Skin Reviewed  Nails Reviewed   Diet Order:   Diet Order             Diet NPO time specified  Diet effective now                  EDUCATION NEEDS:   No education needs have been identified at this time  Skin:  Skin Assessment: Reviewed RN Assessment (multiple small wounds extremities, cellulitis)  Last BM:  pta  Height:   Ht Readings from Last 1 Encounters:  07/27/22 5\' 10"  (1.778 m)    Weight:   Wt Readings from Last 1 Encounters:  07/27/22 63.5 kg    Ideal Body Weight:  75.45 kg  BMI:  There is no height or weight on file to calculate BMI.  Estimated Nutritional Needs:   Kcal:  2109kcal/day  Protein:  95-110g/day  Fluid:  1.9-2.2L/day  Koleen Distance MS, RD, LDN Please refer to [facility] for RD and/or RD on-call/weekend/after hours pager

## 2022-08-04 NOTE — Progress Notes (Signed)
Update given to mother over the phone, password set up- 1998. She plans to give it to the patient's sister, grandmother, uncle and possibly ex-girlfriend.   Has had this girlfriend for 15 years, she is a  Marine scientist at [employer].  Mother states the patient was suicidal a week ago, he expressed to his girlfriend that he would use heroine in order to kill himself, he also took someone's phone to text her that he was "found DOA and was at [facility] in [facility]." She feels like this was all prompted by the girlfriend ending their relationship because of his continues drug use and problems with addiction. She has never been a drug user to the mother's knowledge.   Mother states the patient's problems with addiction started 15 years ago and he has been in and out of rehab numerous times. She also shared he was diagnosed with Hep C about a month or so ago.  She lives in [place] ([place] miles away) but grandmother and the patient's sister are within a few hours. Mother states she believes he was doing really well and was staying clean up until the event with his (ex)girlfriend.   She shared his drugs of choice have been heroine and meth when he cannot get heroine. He told her he "was running out of veins."  She shared she was looking into flights but due to the IVC and him not being allowed visitors she is staying home but will be in contact with staff for updates. All questions and concerns addressed.

## 2022-08-04 NOTE — ED Notes (Signed)
Lab called critical result of lactic acid at 4.6.  Dr. Leonides Schanz notified.

## 2022-08-04 NOTE — ED Notes (Addendum)
Intubated at this time 23 at the lip, 7.5 tube. + color change

## 2022-08-04 NOTE — Progress Notes (Signed)
Pharmacy Antibiotic Note  Brian Decker. is a 27 y.o. male admitted on 08/03/2022 with suspected diabetic foot infection., high risk for MRSA.Marland Kitchen  Pharmacy has been consulted for Cefepime & Vancomycin dosing for 7 days.  Plan: Cefepime 2 gm q8hr per indication & renal fxn.  Pt given Vancomycin 1000 mg once. Vancomycin 750 mg IV Q 12 hrs. Goal AUC 400-550. Expected AUC: 450.4 SCr used: 1.23, TBW 63.5 kg < IBW 73 kg  Pharmacy will continue to follow and will adjust abx dosing whenever warranted.  Temp (24hrs), Avg:97.4 F (36.3 C), Min:95.4 F (35.2 C), Max:101.8 F (38.8 C)   Recent Labs  Lab 08/03/22 2237 08/03/22 2314 08/04/22 0345  WBC 33.6*  --  21.8*  CREATININE 1.89*  --  1.23  LATICACIDVEN  --  4.6* 1.3    Estimated Creatinine Clearance: 82.5 mL/min (by C-G formula based on SCr of 1.23 mg/dL).    Allergies  Allergen Reactions   Bee Venom Anaphylaxis    Antimicrobials this admission: 1/15 Cefepime >> x 7 days 1/15 Vancomycin >> x 7 days 1/15 Flagyl >> x 7 days  Microbiology results: 1/14 BCx: Pending 1/14 UCx: Pending   Thank you for allowing pharmacy to be a part of this patient's care.  Renda Rolls, PharmD, [facility] 08/04/2022 5:54 AM

## 2022-08-04 NOTE — Progress Notes (Signed)
*  PRELIMINARY RESULTS* Echocardiogram 2D Echocardiogram has been performed.  Brian Decker 08/04/2022, 2:06 PM

## 2022-08-05 ENCOUNTER — Encounter: Payer: Self-pay | Admitting: Pulmonary Disease

## 2022-08-05 DIAGNOSIS — T50902A Poisoning by unspecified drugs, medicaments and biological substances, intentional self-harm, initial encounter: Secondary | ICD-10-CM | POA: Diagnosis not present

## 2022-08-05 DIAGNOSIS — R4182 Altered mental status, unspecified: Secondary | ICD-10-CM

## 2022-08-05 LAB — CBC
HCT: 40.6 % (ref 39.0–52.0)
Hemoglobin: 13.4 g/dL (ref 13.0–17.0)
MCH: 29.5 pg (ref 26.0–34.0)
MCHC: 33 g/dL (ref 30.0–36.0)
MCV: 89.4 fL (ref 80.0–100.0)
Platelets: 209 10*3/uL (ref 150–400)
RBC: 4.54 MIL/uL (ref 4.22–5.81)
RDW: 13.1 % (ref 11.5–15.5)
WBC: 14.9 10*3/uL — ABNORMAL HIGH (ref 4.0–10.5)
nRBC: 0 % (ref 0.0–0.2)

## 2022-08-05 LAB — COMPREHENSIVE METABOLIC PANEL
ALT: 41 U/L (ref 0–44)
AST: 94 U/L — ABNORMAL HIGH (ref 15–41)
Albumin: 2.8 g/dL — ABNORMAL LOW (ref 3.5–5.0)
Alkaline Phosphatase: 74 U/L (ref 38–126)
Anion gap: 9 (ref 5–15)
BUN: 15 mg/dL (ref 6–20)
CO2: 25 mmol/L (ref 22–32)
Calcium: 7.9 mg/dL — ABNORMAL LOW (ref 8.9–10.3)
Chloride: 99 mmol/L (ref 98–111)
Creatinine, Ser: 1.05 mg/dL (ref 0.61–1.24)
GFR, Estimated: >60 mL/min (ref >60)
Glucose, Bld: 93 mg/dL (ref 70–99)
Potassium: 4.5 mmol/L (ref 3.5–5.1)
Sodium: 133 mmol/L — ABNORMAL LOW (ref 135–145)
Total Bilirubin: 1 mg/dL (ref 0.3–1.2)
Total Protein: 5.3 g/dL — ABNORMAL LOW (ref 6.5–8.1)

## 2022-08-05 LAB — GLUCOSE, CAPILLARY
Glucose-Capillary: 113 mg/dL — ABNORMAL HIGH (ref 70–99)
Glucose-Capillary: 95 mg/dL (ref 70–99)
Glucose-Capillary: 95 mg/dL (ref 70–99)
Glucose-Capillary: 125 mg/dL — ABNORMAL HIGH (ref 70–99)

## 2022-08-05 LAB — URINE CULTURE: Culture: NO GROWTH

## 2022-08-05 LAB — MAGNESIUM: Magnesium: 2 mg/dL (ref 1.7–2.4)

## 2022-08-05 LAB — PHOSPHORUS: Phosphorus: 2.9 mg/dL (ref 2.5–4.6)

## 2022-08-05 MED ORDER — MORPHINE SULFATE (PF) 2 MG/ML IV SOLN
2.0000 mg | INTRAVENOUS | Status: DC | PRN
  Administered 2022-08-05: 2 mg via INTRAVENOUS

## 2022-08-05 MED ORDER — FAMOTIDINE 20 MG PO TABS
20.0000 mg | ORAL_TABLET | Freq: Two times a day (BID) | ORAL | Status: DC
  Administered 2022-08-06: 20 mg via ORAL
  Filled 2022-08-05: qty 1

## 2022-08-05 MED ORDER — THIAMINE MONONITRATE 100 MG PO TABS
100.0000 mg | ORAL_TABLET | Freq: Every day | ORAL | Status: DC
  Administered 2022-08-05 – 2022-08-06 (×2): 100 mg via ORAL
  Filled 2022-08-05 (×2): qty 1

## 2022-08-05 MED ORDER — LORAZEPAM 1 MG PO TABS: 1.0000 mg | ORAL_TABLET | ORAL | Status: DC | PRN

## 2022-08-05 MED ORDER — ADULT MULTIVITAMIN W/MINERALS CH
1.0000 | ORAL_TABLET | Freq: Every day | ORAL | Status: DC
  Administered 2022-08-05 – 2022-08-06 (×2): 1 via ORAL
  Filled 2022-08-05 (×2): qty 1

## 2022-08-05 MED ORDER — MORPHINE SULFATE (PF) 2 MG/ML IV SOLN
INTRAVENOUS | Status: AC
  Filled 2022-08-05: qty 1

## 2022-08-05 MED ORDER — DOXYCYCLINE HYCLATE 100 MG PO TABS
100.0000 mg | ORAL_TABLET | Freq: Two times a day (BID) | ORAL | Status: DC
  Administered 2022-08-05: 100 mg
  Filled 2022-08-05 (×2): qty 1

## 2022-08-05 MED ORDER — ACETAMINOPHEN 325 MG PO TABS: 650.0000 mg | ORAL_TABLET | Freq: Four times a day (QID) | ORAL | Status: DC | PRN

## 2022-08-05 MED ORDER — THIAMINE HCL 100 MG/ML IJ SOLN: 100.0000 mg | Freq: Every day | INTRAMUSCULAR | Status: DC

## 2022-08-05 MED ORDER — FOLIC ACID 1 MG PO TABS
1.0000 mg | ORAL_TABLET | Freq: Every day | ORAL | Status: DC
  Administered 2022-08-05 – 2022-08-06 (×2): 1 mg via ORAL
  Filled 2022-08-05 (×2): qty 1

## 2022-08-05 MED ORDER — LEVETIRACETAM IN NACL 1000 MG/100ML IV SOLN
1000.0000 mg | Freq: Once | INTRAVENOUS | Status: AC
  Administered 2022-08-05: 1000 mg via INTRAVENOUS
  Filled 2022-08-05: qty 100

## 2022-08-05 MED ORDER — DOXYCYCLINE HYCLATE 100 MG PO TABS: 100.0000 mg | ORAL_TABLET | Freq: Two times a day (BID) | ORAL | Status: DC

## 2022-08-05 MED ORDER — THIAMINE MONONITRATE 100 MG PO TABS: 100.0000 mg | ORAL_TABLET | Freq: Every day | ORAL | Status: DC

## 2022-08-05 MED ORDER — LEVETIRACETAM IN NACL 500 MG/100ML IV SOLN
500.0000 mg | Freq: Two times a day (BID) | INTRAVENOUS | Status: DC
  Filled 2022-08-05: qty 100

## 2022-08-05 MED ORDER — DOCUSATE SODIUM 50 MG/5ML PO LIQD: 100.0000 mg | Freq: Two times a day (BID) | ORAL | Status: DC | PRN

## 2022-08-05 MED ORDER — IBUPROFEN 400 MG PO TABS
600.0000 mg | ORAL_TABLET | Freq: Four times a day (QID) | ORAL | Status: DC | PRN
  Administered 2022-08-05: 600 mg via ORAL

## 2022-08-05 MED ORDER — FOLIC ACID 1 MG PO TABS: 1.0000 mg | ORAL_TABLET | Freq: Every day | ORAL | Status: DC

## 2022-08-05 MED ORDER — IBUPROFEN 400 MG PO TABS
ORAL_TABLET | ORAL | Status: AC
  Filled 2022-08-05: qty 2

## 2022-08-05 MED ORDER — LEVETIRACETAM IN NACL 1000 MG/100ML IV SOLN
1000.0000 mg | Freq: Two times a day (BID) | INTRAVENOUS | Status: DC
  Filled 2022-08-05: qty 100

## 2022-08-05 NOTE — Evaluation (Signed)
Occupational Therapy Evaluation Patient Details Name: Brian Decker. MRN: [id number] DOB: Feb 06, 1996 Today's Date: 08/05/2022   History of Present Illness 27 y/o male presented to ED on 08/03/22 for "acting out" and combative. Intubated 1/15-1/16. PMH: IV drug abuse, polysubstance abuse   Clinical Impression   Mr Jiminez was seen for OT/PT co-evaluation this date. Prior to hospital admission, pt was IND. Pt presents to acute OT demonstrating impaired ADL performance and functional mobility 2/2 decreased activity tolerance. Pt currently requires SUPERVISION bed mobility and sitting balance. MOD I don B socks bed level. CGA + RW multiple sit<>stands - assist for impulsivity and LLE pain limiting participation. Pt would benefit from skilled OT to address noted impairments and functional limitations (see below for any additional details). Upon hospital discharge, recommend no OT follow up.   Recommendations for follow up therapy are one component of a multi-disciplinary discharge planning process, led by the attending physician.  Recommendations may be updated based on patient status, additional functional criteria and insurance authorization.   Follow Up Recommendations  No OT follow up     Assistance Recommended at Discharge Frequent or constant Supervision/Assistance  Patient can return home with the following A little help with walking and/or transfers;A little help with bathing/dressing/bathroom;Help with stairs or ramp for entrance    Functional Status Assessment  Patient has had a recent decline in their functional status and demonstrates the ability to make significant improvements in function in a reasonable and predictable amount of time.  Equipment Recommendations  None recommended by OT    Recommendations for Other Services       Precautions / Restrictions Precautions Precautions: Fall Restrictions Weight Bearing Restrictions: No      Mobility Bed Mobility Overal bed  mobility: Needs Assistance Bed Mobility: Supine to Sit, Sit to Supine     Supine to sit: Supervision Sit to supine: Supervision        Transfers Overall transfer level: Needs assistance Equipment used: Rolling walker (2 wheels) Transfers: Sit to/from Stand Sit to Stand: Supervision                  Balance Overall balance assessment: Needs assistance Sitting-balance support: Feet supported, No upper extremity supported Sitting balance-Leahy Scale: Good     Standing balance support: Bilateral upper extremity supported, During functional activity Standing balance-Leahy Scale: Fair                             ADL either performed or assessed with clinical judgement   ADL Overall ADL's : Needs assistance/impaired                                       General ADL Comments: MOD I don B socks bed level. CGA + RW simulated BSC t/f - assist for impulsivity and LLE pain limiting participation      Pertinent Vitals/Pain Pain Assessment Pain Assessment: Faces Faces Pain Scale: Hurts even more Pain Location: L leg Pain Descriptors / Indicators: Grimacing, Crying Pain Intervention(s): Limited activity within patient's tolerance, Repositioned, Utilized relaxation techniques     Hand Dominance     Extremity/Trunk Assessment Upper Extremity Assessment Upper Extremity Assessment: Overall WFL for tasks assessed   Lower Extremity Assessment Lower Extremity Assessment: Overall WFL for tasks assessed       Communication Communication Communication: No difficulties   Cognition Arousal/Alertness: Awake/alert  Behavior During Therapy: Impulsive, Restless Overall Cognitive Status: Impaired/Different from baseline Area of Impairment: Orientation, Memory, Following commands                 Orientation Level: Disoriented to, Time   Memory: Decreased recall of precautions Following Commands: Follows one step commands consistently        General Comments: emotionally labile during session, pt crying about pain then laughing about food                Home Living Family/patient expects to be discharged to:: Other (Comment) (hotel)                                        Prior Functioning/Environment Prior Level of Function : Independent/Modified Independent                        OT Problem List: Decreased strength;Decreased activity tolerance;Impaired balance (sitting and/or standing);Decreased safety awareness      OT Treatment/Interventions: Self-care/ADL training;Therapeutic exercise;Energy conservation;DME and/or AE instruction;Therapeutic activities;Patient/family education;Balance training    OT Goals(Current goals can be found in the care plan section) Acute Rehab OT Goals Patient Stated Goal: to walk OT Goal Formulation: With patient Time For Goal Achievement: 08/19/22 Potential to Achieve Goals: Good ADL Goals Pt Will Perform Grooming: Independently;standing (tolerate >10 mins) Pt Will Perform Lower Body Dressing: Independently;sit to/from stand Pt Will Transfer to Toilet: Independently;ambulating;regular height toilet Pt/caregiver will Perform Home Exercise Program: Increased ROM;Increased strength;Independently  OT Frequency: Min 2X/week    Co-evaluation              AM-PAC OT "6 Clicks" Daily Activity     Outcome Measure Help from another person eating meals?: None Help from another person taking care of personal grooming?: A Little Help from another person toileting, which includes using toliet, bedpan, or urinal?: A Little Help from another person bathing (including washing, rinsing, drying)?: A Little Help from another person to put on and taking off regular upper body clothing?: A Little Help from another person to put on and taking off regular lower body clothing?: A Little 6 Click Score: 19   End of Session Nurse Communication: Mobility status  Activity  Tolerance: Patient tolerated treatment well Patient left: in bed;with call bell/phone within reach;with nursing/sitter in room  OT Visit Diagnosis: Other abnormalities of gait and mobility (R26.89);Muscle weakness (generalized) (M62.81)                Time: 5188-4166 OT Time Calculation (min): 19 min Charges:  OT General Charges $OT Visit: 1 Visit OT Evaluation $OT Eval Low Complexity: 1 Low  Dessie Coma, M.S. OTR/L  08/05/22, 4:04 PM  ascom [phone]

## 2022-08-05 NOTE — Evaluation (Signed)
Physical Therapy Evaluation Patient Details Name: Brian Decker. MRN: [id number] DOB: 08-Aug-1995 Today's Date: 08/05/2022  History of Present Illness  27 y/o male presented to ED on 08/03/22 for "acting out" and combative. Intubated 1/15-1/16. Admitted for acute toxic metabolic encephalopathy 2/2 drug overdose and L LE cellulitis. PMH: IV drug abuse, polysubstance abuse  Clinical Impression  Patient admitted with the above. PTA, patient was independent with mobility and ADLs. Patient presents with weakness, impaired balance, decreased activity tolerance, and impaired cognition. Requires supervision for bed mobility and min guard for sit to stand with RW 2/2 impulsivity. Complaining of significant LLE pain. Able to weight shift in standing and small marches with min guard but quickly returns to sitting with pain. Patient will benefit from skilled PT services during acute stay to address listed deficits. No PT follow up recommended at this time.        Recommendations for follow up therapy are one component of a multi-disciplinary discharge planning process, led by the attending physician.  Recommendations may be updated based on patient status, additional functional criteria and insurance authorization.  Follow Up Recommendations No PT follow up      Assistance Recommended at Discharge PRN  Patient can return home with the following       Equipment Recommendations Rolling Grecia Lynk (2 wheels)  Recommendations for Other Services       Functional Status Assessment Patient has had a recent decline in their functional status and demonstrates the ability to make significant improvements in function in a reasonable and predictable amount of time.     Precautions / Restrictions Precautions Precautions: Fall Restrictions Weight Bearing Restrictions: No      Mobility  Bed Mobility Overal bed mobility: Needs Assistance Bed Mobility: Supine to Sit, Sit to Supine     Supine to sit:  Supervision Sit to supine: Supervision        Transfers Overall transfer level: Needs assistance Equipment used: Rolling Curtina Grills (2 wheels) Transfers: Sit to/from Stand Sit to Stand: Min guard           General transfer comment: min guard due to impulsivity    Ambulation/Gait Ambulation/Gait assistance: Min guard   Assistive device: Rolling Tajana Crotteau (2 wheels)       Pre-gait activities: weight shifting and marching in place with RW due to significant pain in L LE. Impulsively returns to sitting    Stairs            Wheelchair Mobility    Modified Rankin (Stroke Patients Only)       Balance Overall balance assessment: Needs assistance Sitting-balance support: Feet supported, No upper extremity supported Sitting balance-Leahy Scale: Good     Standing balance support: Bilateral upper extremity supported, During functional activity Standing balance-Leahy Scale: Fair                               Pertinent Vitals/Pain Pain Assessment Pain Assessment: Faces Faces Pain Scale: Hurts even more Pain Location: L leg Pain Descriptors / Indicators: Grimacing, Crying Pain Intervention(s): Limited activity within patient's tolerance, Monitored during session, Utilized relaxation techniques, Repositioned    Home Living Family/patient expects to be discharged to:: Other (Comment) (hotel)                        Prior Function Prior Level of Function : Independent/Modified Independent  Hand Dominance        Extremity/Trunk Assessment   Upper Extremity Assessment Upper Extremity Assessment: Overall WFL for tasks assessed    Lower Extremity Assessment Lower Extremity Assessment: Overall WFL for tasks assessed       Communication   Communication: No difficulties  Cognition Arousal/Alertness: Awake/alert Behavior During Therapy: Impulsive, Restless Overall Cognitive Status: Impaired/Different from  baseline Area of Impairment: Orientation, Memory, Following commands                 Orientation Level: Disoriented to, Time   Memory: Decreased recall of precautions Following Commands: Follows one step commands consistently       General Comments: emotionally labile during session, pt crying about pain then laughing about food        General Comments      Exercises     Assessment/Plan    PT Assessment Patient needs continued PT services  PT Problem List Decreased strength;Decreased balance;Decreased activity tolerance;Decreased mobility;Decreased cognition;Decreased knowledge of use of DME;Decreased safety awareness;Decreased knowledge of precautions;Pain       PT Treatment Interventions DME instruction;Gait training;Functional mobility training;Therapeutic activities;Therapeutic exercise;Balance training;Patient/family education    PT Goals (Current goals can be found in the Care Plan section)  Acute Rehab PT Goals Patient Stated Goal: did not state PT Goal Formulation: Patient unable to participate in goal setting Time For Goal Achievement: 08/19/22 Potential to Achieve Goals: Good    Frequency Min 2X/week     Co-evaluation               AM-PAC PT "6 Clicks" Mobility  Outcome Measure Help needed turning from your back to your side while in a flat bed without using bedrails?: A Little Help needed moving from lying on your back to sitting on the side of a flat bed without using bedrails?: A Little Help needed moving to and from a bed to a chair (including a wheelchair)?: A Little Help needed standing up from a chair using your arms (e.g., wheelchair or bedside chair)?: A Little Help needed to walk in hospital room?: A Little Help needed climbing 3-5 steps with a railing? : A Little 6 Click Score: 18    End of Session   Activity Tolerance: Patient tolerated treatment well;Patient limited by pain Patient left: in bed;with call bell/phone within  reach;with nursing/sitter in room Nurse Communication: Mobility status PT Visit Diagnosis: Unsteadiness on feet (R26.81);Muscle weakness (generalized) (M62.81);Difficulty in walking, not elsewhere classified (R26.2)    Time: 8502-7741 PT Time Calculation (min) (ACUTE ONLY): 20 min   Charges:   PT Evaluation $PT Eval Low Complexity: 1 Low          Emric Kowalewski A. Gilford Rile PT, DPT [facility] - Acute Rehabilitation Services   Brian Decker 08/05/2022, 4:12 PM

## 2022-08-05 NOTE — Progress Notes (Signed)
Eeg done 

## 2022-08-05 NOTE — Procedures (Signed)
Patient Name: Brian Decker.  MRN: [id number]  Epilepsy Attending: Lora Havens  Referring Physician/Provider: Lang Snow, NP  Date: 08/05/2022 Duration: 25 mins  Patient history: 27 year old male with altered mental status.  EEG to evaluate for seizure.  Level of alertness: Awake, asleep  AEDs during EEG study: Keppra, Versed  Technical aspects: This EEG study was done with scalp electrodes positioned according to the 10-20 International system of electrode placement. Electrical activity was reviewed with band pass filter of 1-70Hz , sensitivity of 7 uV/mm, display speed of 30mm/sec with a 60Hz  notched filter applied as appropriate. EEG data were recorded continuously and digitally stored.  Video monitoring was available and reviewed as appropriate.  Description: During awake state, no clear posterior dominant rhythm was seen. Sleep was characterized by vertex waves, sleep spindles (12 to 14 Hz), maximal frontocentral region.  EEG showed continuous generalized 2 to 3 Hz delta slowing with overriding 12 to 15 Hz beta activity.  Hyperventilation and photic stimulation were not performed.     ABNORMALITY - Continuous slow, generalized - Excessive beta, generalized  IMPRESSION: This study is suggestive of moderate diffuse encephalopathy, nonspecific etiology.  The excessive beta activity is most likely due to effect of benzodiazepines and is a benign EEG pattern.  No seizures or epileptiform discharges were seen throughout the recording.  Turner Kunzman Barbra Sarks

## 2022-08-05 NOTE — Consult Note (Signed)
[facility]   Reason for Consult:  Intentional drug overdose.  Referring Physician:  Lauris PoagAleskkerov, Fuad Patient Identification: Brian KerbsJason Lassalle Jr. MRN:  [id number] Principal Diagnosis: Drug overdose Diagnosis:  Principal Problem:   Drug overdose   Total Time spent with patient: 30 minutes  Subjective:   Brian KerbsJason Traughber Jr. is a 27 y.o. male with severe agitation and altered mental status post substance overdose. Patient states, "I am hopeless. I don't deserve to live".  HPI:  27 y.o male with a history of polysubstance abuse presented with severe agitation and altered mental status. He was admitted to ICU following a suspected overdose, required intubation on 1/15; extubated today.   He currently exhibits clear coherent speech but displays a flat affect and depressed mood, with episodes of inconsolable crying. Patient reports confusion as the reason for hospital visitation. He expresses suicidal ideation and hopelessness particularly in the context of his recent break-up and chronic substance abuse. Patient reports a 10-year history of substance abuse including cocaine, methamphetamine, heroin, and marijuana. He reports being clean for 3 years prior to recent events. Patient denies homicidal ideation, auditory and visual hallucinations. Attempted to reach mother, Lanice SchwabMisty Apple at [phone], no answer; left voicemail.   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 Mother reported patient has a big brother who is an addict and is currently in rehabilitation and is the one who exposed him to drugs. Mother reported the patient started out with pills, his drug of choice is heroin but he mostly does methamphetamines, he would do crack cocaine when he cannot get his hands on any other substances, and he smokes marijuana daily. Mother reported  patient has a long history of substance use, even overdosing and had to be brought back with Narcan.   Mother reported  [facility] over a week before being [facility] program the end of September. Mother reported patient went out with his girlfriend on [place] and never returned to the [facility] and was kicked out. Mother reported patient appeared to be doing good until recent break-up with (ex)girlfriend on New Year's Eve. Mother reported patient was employed at [employer], however, he was terminated after completion of his background check. Mother reported patient has an active warrant due to absconder from probation. Mother reported patient was recently diagnosed with hepatitis C and has no insurance; she would like the patient to receive help with Medicaid assistance.  Also, mother would like for patient to be admitted to a long-term treatment program for substance abuse. Mother informed the patient will be admitted for psychiatric inpatient hospitalization when medically cleared  Past Psychiatric History: He has a history of Cocaine abuse, Crack cocaine use, Heroin abuse and Methamphetamine use.  Risk to Self:   Risk to Others:   Prior Inpatient Therapy:   Prior Outpatient Therapy:    Past Medical History:  Past Medical History:  Diagnosis Date   Asthma    Cocaine abuse (HCC)    Crack cocaine use    Hepatitis    Heroin abuse (HCC) 04/07/2020   Methamphetamine use (HCC) 04/07/2020   History reviewed. No pertinent surgical history. Family History: History reviewed. No pertinent family history. Family Psychiatric  History: None reported  Social History:  Social History   Substance and Sexual Activity  Alcohol Use Yes   Comment: occ     Social History   Substance and Sexual Activity  Drug Use Yes   Types: Marijuana, IV, Cocaine, Methamphetamines   Comment: heroin    Social History   Socioeconomic History   Marital status: Single    Spouse name: Not on file   Number of children: Not on file   Years of education: Not on  file   Highest education level: Not on file  Occupational History   Not on file  Tobacco Use   Smoking status: Every Day    Packs/day: 0.50    Types: Cigarettes   Smokeless tobacco: Never  Vaping Use   Vaping Use: Every day  Substance and Sexual Activity   Alcohol use: Yes    Comment: occ   Drug use: Yes    Types: Marijuana, IV, Cocaine, Methamphetamines    Comment: heroin   Sexual activity: Yes    Birth control/protection: None  Other Topics Concern   Not on file  Social History Narrative   Not on file   Social Determinants of Health   Financial Resource Strain: Not on file  Food Insecurity: Not on file  Transportation Needs: Not on file  Physical Activity: Not on file  Stress: Not on file  Social Connections: Not on file   Additional Social History:    Allergies:   Allergies  Allergen Reactions   Bee Venom Anaphylaxis   Penicillins Anaphylaxis    Per Mom, tolerates cefepime 08/04/2022    Labs:  Results for orders placed or performed during the hospital encounter of 08/03/22 (from the past 48 hour(s))  CBC     Status: Abnormal   Collection Time: 08/03/22 10:37 PM  Result Value Ref Range   WBC 33.6 (H) 4.0 - 10.5 K/uL   RBC 4.47 4.22 - 5.81 MIL/uL   Hemoglobin 13.4 13.0 - 17.0 g/dL   HCT 17.7 11.6 - 57.9 %   MCV 89.3 80.0 - 100.0 fL   MCH 30.0 26.0 - 34.0 pg   MCHC 33.6 30.0 - 36.0 g/dL   RDW 03.8 33.3 - 83.2 %   Platelets 382 150 - 400 K/uL   nRBC 0.0 0.0 - 0.2 %    Comment: Performed at [facility], [address] [place], [place] [address]  Comprehensive metabolic panel     Status: Abnormal   Collection Time: 08/03/22 10:37 PM  Result Value Ref Range   Sodium 134 (L) 135 - 145 mmol/L    Comment: ELECTROLYTES REPEATED TO VERIFY DLB   Potassium 3.9 3.5 - 5.1 mmol/L   Chloride 94 (L) 98 - 111 mmol/L   CO2 10 (L) 22 - 32 mmol/L   Glucose, Bld 61 (L) 70 - 99 mg/dL    Comment: Glucose reference range applies only to samples taken after  fasting for at least 8 hours.   BUN 16 6 - 20 mg/dL   Creatinine, Ser 6.06 (H) 0.61 - 1.24 mg/dL   Calcium 9.7 8.9 - 00.4 mg/dL   Total Protein 8.5 (H) 6.5 - 8.1 g/dL   Albumin 4.6 3.5 - 5.0 g/dL   AST 71 (H) 15 - 41 U/L   ALT 47 (H) 0 - 44 U/L    Comment: RESULT CONFIRMED BY MANUAL DILUTION DLB   Alkaline Phosphatase 77 38 - 126 U/L   Total Bilirubin 1.9 (H) 0.3 - 1.2 mg/dL   GFR, Estimated 50 (L) >60 mL/min    Comment: (NOTE) Calculated using the CKD-EPI Creatinine Equation (2021)    Anion gap 30 (H) 5 - 15    Comment: Performed  at [facility], [address] [place], [place] [address]  Ethanol     Status: None   Collection Time: 08/03/22 10:37 PM  Result Value Ref Range   Alcohol, Ethyl (B) <10 <10 mg/dL    Comment: (NOTE) Lowest detectable limit for serum alcohol is 10 mg/dL.  For medical purposes only. Performed at [facility], [address] [place], [place] [address]   Acetaminophen level     Status: Abnormal   Collection Time: 08/03/22 10:37 PM  Result Value Ref Range   Acetaminophen (Tylenol), Serum <10 (L) 10 - 30 ug/mL    Comment: (NOTE) Therapeutic concentrations vary significantly. A range of 10-30 ug/mL  may be an effective concentration for many patients. However, some  are best treated at concentrations outside of this range. Acetaminophen concentrations >150 ug/mL at 4 hours after ingestion  and >50 ug/mL at 12 hours after ingestion are often associated with  toxic reactions.  Performed at [facility], [address] [place], [place] [address]   Salicylate level     Status: Abnormal   Collection Time: 08/03/22 10:37 PM  Result Value Ref Range   Salicylate Lvl <7.0 (L) 7.0 - 30.0 mg/dL    Comment: Performed at [facility] [address] [place], [place] [address]  Procalcitonin - Baseline     Status: None   Collection Time: 08/03/22 10:53 PM  Result Value Ref Range   Procalcitonin 1.10 ng/mL     Comment:        Interpretation: PCT > 0.5 ng/mL and <= 2 ng/mL: Systemic infection (sepsis) is possible, but other conditions are known to elevate PCT as well. (NOTE)       Sepsis PCT Algorithm           Lower Respiratory Tract                                      Infection PCT Algorithm    ----------------------------     ----------------------------         PCT < 0.25 ng/mL                PCT < 0.10 ng/mL          Strongly encourage             Strongly discourage   discontinuation of antibiotics    initiation of antibiotics    ----------------------------     -----------------------------       PCT 0.25 - 0.50 ng/mL            PCT 0.10 - 0.25 ng/mL               OR       >80% decrease in PCT            Discourage initiation of                                            antibiotics      Encourage discontinuation           of antibiotics    ----------------------------     -----------------------------         PCT >= 0.50 ng/mL              PCT 0.26 -  0.50 ng/mL                AND       <80% decrease in PCT             Encourage initiation of                                             antibiotics       Encourage continuation           of antibiotics    ----------------------------     -----------------------------        PCT >= 0.50 ng/mL                  PCT > 0.50 ng/mL               AND         increase in PCT                  Strongly encourage                                      initiation of antibiotics    Strongly encourage escalation           of antibiotics                                     -----------------------------                                           PCT <= 0.25 ng/mL                                                 OR                                        > 80% decrease in PCT                                      Discontinue / Do not initiate                                             antibiotics  Performed at [facility], [address] [place], [place] [address]   Troponin I (High Sensitivity)     Status: None   Collection Time: 08/03/22 10:54 PM  Result Value Ref Range   Troponin I (High Sensitivity) 10 <18 ng/L    Comment: (NOTE) Elevated high sensitivity troponin I (hsTnI) values and significant  changes across serial measurements may suggest ACS but many other  chronic and acute conditions are known to elevate hsTnI results.  Refer  to the "Links" section for chest pain algorithms and additional  guidance. Performed at [facility], [address] [place], [place] [address]   CK     Status: Abnormal   Collection Time: 08/03/22 10:54 PM  Result Value Ref Range   Total CK 651 (H) 49 - 397 U/L    Comment: Performed at [facility], [address] [place], [place] [address]  Urine Drug Screen, Qualitative ([facility] only)     Status: Abnormal   Collection Time: 08/03/22 11:14 PM  Result Value Ref Range   Tricyclic, Ur Screen NONE DETECTED NONE DETECTED   Amphetamines, Ur Screen POSITIVE (A) NONE DETECTED   MDMA (Ecstasy)Ur Screen NONE DETECTED NONE DETECTED   Cocaine Metabolite,Ur [place] POSITIVE (A) NONE DETECTED   Opiate, Ur Screen NONE DETECTED NONE DETECTED   Phencyclidine (PCP) Ur S NONE DETECTED NONE DETECTED   Cannabinoid 50 Ng, Ur [place] POSITIVE (A) NONE DETECTED   Barbiturates, Ur Screen NONE DETECTED NONE DETECTED   Benzodiazepine, Ur Scrn POSITIVE (A) NONE DETECTED   Methadone Scn, Ur NONE DETECTED NONE DETECTED    Comment: (NOTE) Tricyclics + metabolites, urine    Cutoff 1000 ng/mL Amphetamines + metabolites, urine  Cutoff 1000 ng/mL MDMA (Ecstasy), urine              Cutoff 500 ng/mL Cocaine Metabolite, urine          Cutoff 300 ng/mL Opiate + metabolites, urine        Cutoff 300 ng/mL Phencyclidine (PCP), urine         Cutoff 25 ng/mL Cannabinoid, urine                 Cutoff 50 ng/mL Barbiturates + metabolites, urine  Cutoff 200 ng/mL Benzodiazepine, urine              Cutoff  200 ng/mL Methadone, urine                   Cutoff 300 ng/mL  The urine drug screen provides only a preliminary, unconfirmed analytical test result and should not be used for non-medical purposes. Clinical consideration and professional judgment should be applied to any positive drug screen result due to possible interfering substances. A more specific alternate chemical method must be used in order to obtain a confirmed analytical result. Gas chromatography / mass spectrometry (GC/MS) is the preferred confirm atory method. Performed at [facility], [address] [place], [place] [address]   Resp panel by RT-PCR (RSV, Flu A&B, Covid) Anterior Nasal Swab     Status: None   Collection Time: 08/03/22 11:14 PM   Specimen: Anterior Nasal Swab  Result Value Ref Range   SARS Coronavirus 2 by RT PCR NEGATIVE NEGATIVE    Comment: (NOTE) SARS-CoV-2 target nucleic acids are NOT DETECTED.  The SARS-CoV-2 RNA is generally detectable in upper respiratory specimens during the acute phase of infection. The lowest concentration of SARS-CoV-2 viral copies this assay can detect is 138 copies/mL. A negative result does not preclude SARS-Cov-2 infection and should not be used as the sole basis for treatment or other patient management decisions. A negative result may occur with  improper specimen collection/handling, submission of specimen other than nasopharyngeal swab, presence of viral mutation(s) within the areas targeted by this assay, and inadequate number of viral copies(<138 copies/mL). A negative result must be combined with clinical observations, patient history, and epidemiological information. The expected result is Negative.  Fact Sheet for Patients:  BloggerCourse.com  Fact Sheet for Healthcare Providers:  SeriousBroker.it  This test is no t yet approved or cleared by the [place] FDA and  has been authorized for  detection and/or diagnosis of SARS-CoV-2 by FDA under an Emergency Use Authorization (EUA). This EUA will remain  in effect (meaning this test can be used) for the duration of the COVID-19 declaration under Section 564(b)(1) of the Act, 21 U.S.C.section 360bbb-3(b)(1), unless the authorization is terminated  or revoked sooner.       Influenza A by PCR NEGATIVE NEGATIVE   Influenza B by PCR NEGATIVE NEGATIVE    Comment: (NOTE) The Xpert Xpress SARS-CoV-2/FLU/RSV plus assay is intended as an aid in the diagnosis of influenza from Nasopharyngeal swab specimens and should not be used as a sole basis for treatment. Nasal washings and aspirates are unacceptable for Xpert Xpress SARS-CoV-2/FLU/RSV testing.  Fact Sheet for Patients: BloggerCourse.com  Fact Sheet for Healthcare Providers: SeriousBroker.it  This test is not yet approved or cleared by the [place] FDA and has been authorized for detection and/or diagnosis of SARS-CoV-2 by FDA under an Emergency Use Authorization (EUA). This EUA will remain in effect (meaning this test can be used) for the duration of the COVID-19 declaration under Section 564(b)(1) of the Act, 21 U.S.C. section 360bbb-3(b)(1), unless the authorization is terminated or revoked.     Resp Syncytial Virus by PCR NEGATIVE NEGATIVE    Comment: (NOTE) Fact Sheet for Patients: BloggerCourse.com  Fact Sheet for Healthcare Providers: SeriousBroker.it  This test is not yet approved or cleared by the [place] FDA and has been authorized for detection and/or diagnosis of SARS-CoV-2 by FDA under an Emergency Use Authorization (EUA). This EUA will remain in effect (meaning this test can be used) for the duration of the COVID-19 declaration under Section 564(b)(1) of the Act, 21 U.S.C. section 360bbb-3(b)(1), unless the authorization is terminated  or revoked.  Performed at [facility], [address] [place], [place] [address]   Lactic acid, plasma     Status: Abnormal   Collection Time: 08/03/22 11:14 PM  Result Value Ref Range   Lactic Acid, Venous 4.6 (HH) 0.5 - 1.9 mmol/L    Comment: CRITICAL RESULT CALLED TO, READ BACK BY AND VERIFIED WITH [place]na EDWARDS AT [phone] 08/04/2022 DLB Performed at [facility], [address] [place], [place] [address]   Protime-INR     Status: Abnormal   Collection Time: 08/03/22 11:14 PM  Result Value Ref Range   Prothrombin Time 15.9 (H) 11.4 - 15.2 seconds   INR 1.3 (H) 0.8 - 1.2    Comment: (NOTE) INR goal varies based on device and disease states. Performed at [facility], [address] [place], [place] [address]   APTT     Status: Abnormal   Collection Time: 08/03/22 11:14 PM  Result Value Ref Range   aPTT 38 (H) 24 - 36 seconds    Comment:        IF BASELINE aPTT IS ELEVATED, SUGGEST PATIENT RISK ASSESSMENT BE USED TO DETERMINE APPROPRIATE ANTICOAGULANT THERAPY. Performed at [facility], [address] [place], [place] [address]   Blood Culture (routine x 2)     Status: None (Preliminary result)   Collection Time: 08/03/22 11:14 PM   Specimen: BLOOD  Result Value Ref Range   Specimen Description      BLOOD BLOOD RIGHT HAND Performed at [facility], [address], [place], [place] [address]    Special  Requests      BOTTLES DRAWN AEROBIC AND ANAEROBIC Blood Culture results may not be optimal due to an excessive volume of blood received in culture bottles Performed at [facility], [address] [place], [place] [address]    Culture      NO GROWTH 2 DAYS Performed at [facility], [address] [place], [place] [address]    Report Status PENDING   Blood Culture (routine x 2)     Status: None (Preliminary result)   Collection Time: 08/03/22 11:14 PM   Specimen: BLOOD  Result Value Ref Range    Specimen Description BLOOD BLOOD RIGHT FOREARM    Special Requests      BOTTLES DRAWN AEROBIC AND ANAEROBIC Blood Culture results may not be optimal due to an excessive volume of blood received in culture bottles   Culture      NO GROWTH < 12 HOURS Performed at [facility], [address] [place], [place] [address]    Report Status PENDING   Urinalysis, Complete w Microscopic     Status: Abnormal   Collection Time: 08/03/22 11:14 PM  Result Value Ref Range   Color, Urine YELLOW (A) YELLOW   APPearance CLOUDY (A) CLEAR   Specific Gravity, Urine 1.018 1.005 - 1.030   pH 5.0 5.0 - 8.0   Glucose, UA NEGATIVE NEGATIVE mg/dL   Hgb urine dipstick MODERATE (A) NEGATIVE   Bilirubin Urine NEGATIVE NEGATIVE   Ketones, ur NEGATIVE NEGATIVE mg/dL   Protein, ur 132100 (A) NEGATIVE mg/dL   Nitrite NEGATIVE NEGATIVE   Leukocytes,Ua NEGATIVE NEGATIVE   RBC / HPF 0-5 0 - 5 RBC/hpf   WBC, UA 0-5 0 - 5 WBC/hpf   Bacteria, UA MANY (A) NONE SEEN   Squamous Epithelial / HPF 0-5 0 - 5 /HPF   Mucus PRESENT    Hyaline Casts, UA PRESENT    Sperm, UA PRESENT     Comment: Performed at [facility], [address] [place], [place] [address]  Urine Culture     Status: None   Collection Time: 08/03/22 11:14 PM   Specimen: In/Out Cath Urine  Result Value Ref Range   Specimen Description      IN/OUT CATH URINE Performed at [facility], [address] [place], [place] [address]    Special Requests      NONE Performed at [facility], [address] [place], [place] [address]    Culture      NO GROWTH Performed at [facility], [address] [place], [place] [address]    Report Status 08/05/2022 FINAL   Blood gas, venous     Status: Abnormal   Collection Time: 08/04/22 12:14 AM  Result Value Ref Range   pH, Ven 7.37 7.25 - 7.43   pCO2, Ven 45 44 - 60 mmHg   pO2, Ven 119 (H) 32 - 45 mmHg   Bicarbonate 26.0 20.0 - 28.0 mmol/L   Acid-Base Excess  0.3 0.0 - 2.0 mmol/L   O2 Saturation 100 %   Patient temperature 37.0    Collection site VEIN     Comment: Performed at [facility], [address] [place], [place] [address]  CBG monitoring, ED     Status: None   Collection Time: 08/04/22 12:25 AM  Result Value Ref Range   Glucose-Capillary 85 70 - 99 mg/dL    Comment: Glucose reference range applies only to samples taken after fasting for  at least 8 hours.  Glucose, capillary     Status: Abnormal   Collection Time: 08/04/22  3:02 AM  Result Value Ref Range   Glucose-Capillary 107 (H) 70 - 99 mg/dL    Comment: Glucose reference range applies only to samples taken after fasting for at least 8 hours.   Comment 1 QC Due   Blood gas, arterial     Status: Abnormal   Collection Time: 08/04/22  3:14 AM  Result Value Ref Range   FIO2 28 %   Delivery systems VENTILATOR    Mode ASSIST CONTROL    MECHVT 450 mL   RATE 18 resp/min   PEEP 5 cm H20   pH, Arterial 7.39 7.35 - 7.45   pCO2 arterial 42 32 - 48 mmHg   pO2, Arterial 145 (H) 83 - 108 mmHg   Bicarbonate 25.4 20.0 - 28.0 mmol/L   Acid-Base Excess 0.3 0.0 - 2.0 mmol/L   O2 Saturation 100 %   Patient temperature 37.0    Collection site RIGHT RADIAL    Allens test (pass/fail) PASS PASS    Comment: Performed at [facility], [address] [place], [place] [address]  Lactic acid, plasma     Status: None   Collection Time: 08/04/22  3:45 AM  Result Value Ref Range   Lactic Acid, Venous 1.3 0.5 - 1.9 mmol/L    Comment: Performed at [facility], [address] [place], [place] [address]  HIV Antibody (routine testing w rflx)     Status: None   Collection Time: 08/04/22  3:45 AM  Result Value Ref Range   HIV Screen 4th Generation wRfx Non Reactive Non Reactive    Comment: Performed at [facility], [address] [place], [place] [address]  Phosphorus     Status: None   Collection Time: 08/04/22  3:45 AM  Result Value Ref Range   Phosphorus  3.6 2.5 - 4.6 mg/dL    Comment: Performed at [facility], [address] [place], [place] [address]  Magnesium     Status: None   Collection Time: 08/04/22  3:45 AM  Result Value Ref Range   Magnesium 2.1 1.7 - 2.4 mg/dL    Comment: Performed at [facility], [address] [place], [place] [address]  Comprehensive metabolic panel     Status: Abnormal   Collection Time: 08/04/22  3:45 AM  Result Value Ref Range   Sodium 132 (L) 135 - 145 mmol/L   Potassium 3.8 3.5 - 5.1 mmol/L   Chloride 97 (L) 98 - 111 mmol/L   CO2 25 22 - 32 mmol/L   Glucose, Bld 114 (H) 70 - 99 mg/dL    Comment: Glucose reference range applies only to samples taken after fasting for at least 8 hours.   BUN 15 6 - 20 mg/dL   Creatinine, Ser 1.301.23 0.61 - 1.24 mg/dL   Calcium 8.7 (L) 8.9 - 10.3 mg/dL   Total Protein 6.9 6.5 - 8.1 g/dL   Albumin 4.1 3.5 - 5.0 g/dL   AST 62 (H) 15 - 41 U/L   ALT 39 0 - 44 U/L   Alkaline Phosphatase 57 38 - 126 U/L   Total Bilirubin 2.0 (H) 0.3 - 1.2 mg/dL   GFR, Estimated >86>60 >57>60 mL/min    Comment: (NOTE) Calculated using the CKD-EPI Creatinine Equation (2021)    Anion gap 10 5 - 15    Comment: Performed at [facility], [address] [place], [place] [address]  CBC     Status: Abnormal   Collection Time: 08/04/22  3:45 AM  Result Value Ref Range   WBC 21.8 (H) 4.0 - 10.5 K/uL   RBC 4.08 (L) 4.22 - 5.81 MIL/uL   Hemoglobin 12.2 (L) 13.0 - 17.0 g/dL   HCT 34.4 (L) 39.0 - 52.0 %   MCV 84.3 80.0 - 100.0 fL   MCH 29.9 26.0 - 34.0 pg   MCHC 35.5 30.0 - 36.0 g/dL   RDW 12.5 11.5 - 15.5 %   Platelets 275 150 - 400 K/uL   nRBC 0.0 0.0 - 0.2 %    Comment: Performed at [facility], [address], [place], [place] [address]  Aerobic/Anaerobic Culture w Gram Stain (surgical/deep wound)     Status: None (Preliminary result)   Collection Time: 08/04/22 10:50 AM   Specimen: Leg; Wound  Result Value Ref Range   Specimen Description       LEG Performed at [facility], [address], [place], [place] [address]    Special Requests      LEFT LEG Performed at [facility], [address] [place], [place] [address]    Gram Stain FEW GRAM POSITIVE COCCI IN PAIRS NO WBC SEEN     Culture      FEW STAPHYLOCOCCUS AUREUS SUSCEPTIBILITIES TO FOLLOW Performed at [facility], [place] [address] [place], [place] [address]    Report Status PENDING   Glucose, capillary     Status: None   Collection Time: 08/04/22 11:34 AM  Result Value Ref Range   Glucose-Capillary 73 70 - 99 mg/dL    Comment: Glucose reference range applies only to samples taken after fasting for at least 8 hours.  Glucose, capillary     Status: None   Collection Time: 08/04/22  4:14 PM  Result Value Ref Range   Glucose-Capillary 87 70 - 99 mg/dL    Comment: Glucose reference range applies only to samples taken after fasting for at least 8 hours.  Glucose, capillary     Status: None   Collection Time: 08/04/22  7:44 PM  Result Value Ref Range   Glucose-Capillary 74 70 - 99 mg/dL    Comment: Glucose reference range applies only to samples taken after fasting for at least 8 hours.  Glucose, capillary     Status: None   Collection Time: 08/04/22 11:38 PM  Result Value Ref Range   Glucose-Capillary 98 70 - 99 mg/dL    Comment: Glucose reference range applies only to samples taken after fasting for at least 8 hours.  Glucose, capillary     Status: Abnormal   Collection Time: 08/05/22  3:44 AM  Result Value Ref Range   Glucose-Capillary 113 (H) 70 - 99 mg/dL    Comment: Glucose reference range applies only to samples taken after fasting for at least 8 hours.  Comprehensive metabolic panel     Status: Abnormal   Collection Time: 08/05/22  5:25 AM  Result Value Ref Range   Sodium 133 (L) 135 - 145 mmol/L   Potassium 4.5 3.5 - 5.1 mmol/L   Chloride 99 98 - 111 mmol/L   CO2 25 22 - 32 mmol/L   Glucose, Bld 93 70 - 99 mg/dL    Comment:  Glucose reference range applies only to samples taken after fasting for at least 8 hours.   BUN 15 6 - 20 mg/dL   Creatinine, Ser 1.05 0.61 -  1.24 mg/dL   Calcium 7.9 (L) 8.9 - 10.3 mg/dL   Total Protein 5.3 (L) 6.5 - 8.1 g/dL   Albumin 2.8 (L) 3.5 - 5.0 g/dL   AST 94 (H) 15 - 41 U/L   ALT 41 0 - 44 U/L   Alkaline Phosphatase 74 38 - 126 U/L   Total Bilirubin 1.0 0.3 - 1.2 mg/dL   GFR, Estimated >60 >60 mL/min    Comment: (NOTE) Calculated using the CKD-EPI Creatinine Equation (2021)    Anion gap 9 5 - 15    Comment: Performed at [facility], [address] [place], [place] [address]  CBC     Status: Abnormal   Collection Time: 08/05/22  5:25 AM  Result Value Ref Range   WBC 14.9 (H) 4.0 - 10.5 K/uL   RBC 4.54 4.22 - 5.81 MIL/uL   Hemoglobin 13.4 13.0 - 17.0 g/dL   HCT 40.6 39.0 - 52.0 %   MCV 89.4 80.0 - 100.0 fL   MCH 29.5 26.0 - 34.0 pg   MCHC 33.0 30.0 - 36.0 g/dL   RDW 13.1 11.5 - 15.5 %   Platelets 209 150 - 400 K/uL   nRBC 0.0 0.0 - 0.2 %    Comment: Performed at [facility], [address], [place], [place] [address]  Magnesium     Status: None   Collection Time: 08/05/22  5:25 AM  Result Value Ref Range   Magnesium 2.0 1.7 - 2.4 mg/dL    Comment: Performed at [facility], [address], [place], [place] [address]  Phosphorus     Status: None   Collection Time: 08/05/22  5:25 AM  Result Value Ref Range   Phosphorus 2.9 2.5 - 4.6 mg/dL    Comment: Performed at [facility], [address] [place],  [address]  Glucose, capillary     Status: None   Collection Time: 08/05/22  8:31 AM  Result Value Ref Range   Glucose-Capillary 95 70 - 99 mg/dL    Comment: Glucose reference range applies only to samples taken after fasting for at least 8 hours.  Glucose, capillary     Status: None   Collection Time: 08/05/22 11:16 AM  Result Value Ref Range   Glucose-Capillary 95 70 - 99 mg/dL    Comment: Glucose reference range  applies only to samples taken after fasting for at least 8 hours.    Current Facility-Administered Medications  Medication Dose Route Frequency Provider Last Rate Last Admin   acetaminophen (TYLENOL) suppository 650 mg  650 mg Rectal Once Ward, Kristen N, DO       acetaminophen (TYLENOL) tablet 650 mg  650 mg Per Tube Q6H PRN Ottie Glazier, MD   650 mg at 08/05/22 0559   Chlorhexidine Gluconate Cloth 2 % PADS 6 each  6 each Topical Daily Ottie Glazier, MD   6 each at 08/05/22 0824   docusate (COLACE) 50 MG/5ML liquid 100 mg  100 mg Per Tube BID PRN Lang Snow, NP       docusate (COLACE) 50 MG/5ML liquid 100 mg  100 mg Per Tube BID Lang Snow, NP   100 mg at 08/05/22 0900   doxycycline (VIBRA-TABS) tablet 100 mg  100 mg Per Tube Q12H Ottie Glazier, MD       enoxaparin (LOVENOX) injection 40 mg  40 mg Subcutaneous Q24H Lang Snow, NP   40 mg at 08/05/22 0900   famotidine (PEPCID) tablet 20 mg  20 mg Per Tube BID Jimmye Norman, NP   20 mg at 08/05/22 0900   folic acid (FOLVITE) tablet 1 mg  1 mg Oral Daily Vida Rigger, MD       LORazepam (ATIVAN) tablet 1-4 mg  1-4 mg Oral Q1H PRN Vida Rigger, MD       Or   LORazepam (ATIVAN) tablet 1 mg  1 mg Oral Q1H PRN Vida Rigger, MD       multivitamin with minerals tablet 1 tablet  1 tablet Oral Daily Aleskerov, Fuad, MD       polyethylene glycol (MIRALAX / GLYCOLAX) packet 17 g  17 g Per Tube Daily PRN Jimmye Norman, NP       polyethylene glycol (MIRALAX / GLYCOLAX) packet 17 g  17 g Per Tube Daily Jimmye Norman, NP   17 g at 08/05/22 0900   thiamine (VITAMIN B1) tablet 100 mg  100 mg Oral Daily Vida Rigger, MD       Or   thiamine (VITAMIN B1) injection 100 mg  100 mg Intravenous Daily Vida Rigger, MD        Musculoskeletal: Strength & Muscle Tone: within normal limits Gait & Station:  Did not observe  Patient leans: N/A            Psychiatric  Specialty Exam:  Presentation  General Appearance: Disheveled  Eye Contact:Poor  Speech:Clear and Coherent  Speech Volume:Normal  Handedness:Right   Mood and Affect  Mood:Depressed; Hopeless; Worthless  Affect:Depressed; Flat; Tearful   Thought Process  Thought Processes:Coherent  Descriptions of Associations:Intact  Orientation:Full (Time, Place and Person)  Thought Content:Illogical  History of Schizophrenia/Schizoaffective disorder:No data recorded Duration of Psychotic Symptoms:No data recorded Hallucinations:Hallucinations: None  Ideas of Reference:None  Suicidal Thoughts:Suicidal Thoughts: Yes, Passive  Homicidal Thoughts:Homicidal Thoughts: No   Sensorium  Memory:Immediate Good  Judgment:Poor  Insight:Poor   Executive Functions  Concentration:Fair  Attention Span:Fair  Recall:Fair  Fund of Knowledge:Fair  Language:Good   Psychomotor Activity  Psychomotor Activity:Psychomotor Activity: Restlessness   Assets  Assets:Social Support; Resilience   Sleep  Sleep:Sleep: Good   Physical Exam: Physical Exam HENT:     Head: Normocephalic.  Pulmonary:     Effort: No respiratory distress.  Musculoskeletal:        General: Normal range of motion.  Skin:    General: Skin is dry.  Neurological:     Mental Status: He is alert and oriented to person, place, and time.    Review of Systems  Constitutional:  Negative for diaphoresis.  HENT:  Negative for hearing loss.   Respiratory:  Negative for cough and shortness of breath.   Neurological:  Negative for tremors.  Psychiatric/Behavioral:  Positive for depression, substance abuse and suicidal ideas. Negative for hallucinations.    Blood pressure 109/72, pulse 99, temperature 100.2 F (37.9 C), resp. rate 16, weight 59 kg, SpO2 94 %. Body mass index is 18.66 kg/m.  Treatment Plan Summary: Daily contact with patient to assess and evaluate symptoms. Admit to psychiatric inpatient  hospitalization when medically cleared.    Disposition: Recommend psychiatric Inpatient admission when medically cleared. Supportive therapy provided about ongoing stressors.  Norma Fredrickson, NP 08/05/2022 2:53 PM

## 2022-08-05 NOTE — Progress Notes (Incomplete)
Pharmacy Antibiotic Note  Brian Decker. is a 27 y.o. male admitted on 08/03/2022 with a PMH of substance abuse: cocaine, methamphetamine, heroin, and marijuana. Presented to the ED with altered mental status. Brian Decker was started 01/15 on Cefepime, Vancomycin, and Flagyl for suspected cellulitis and high risk for MRSA. Pharmacy has been consulted for Cefepime & Vancomycin dosing for 7 days.   Plan:  Day 2:   Pt given Vancomycin 1000 mg once. Continue Vancomycin 750 mg IV Q 12 hrs. Goal AUC 400-550. Expected AUC: 447.6 Css min: 12.1 SCr used: 1.05, TBW 59 kg < IBW 73 kg  Continue Cefepime 2 gm q8hr per indication & renal fxn. Continue to monitor renal function.   Pharmacy will continue to follow and will adjust abx dosing whenever warranted.  Temp (24hrs), Avg:100.3 F (37.9 C), Min:99.1 F (37.3 C), Max:101.3 F (38.5 C)   Recent Labs  Lab 08/03/22 2237 08/03/22 2314 08/04/22 0345 08/05/22 0525  WBC 33.6*  --  21.8* 14.9*  CREATININE 1.89*  --  1.23 1.05  LATICACIDVEN  --  4.6* 1.3  --      Estimated Creatinine Clearance: 89.7 mL/min (by C-G formula based on SCr of 1.05 mg/dL).    Allergies  Allergen Reactions   Bee Venom Anaphylaxis   Penicillins Anaphylaxis    Per Mom, tolerates cefepime 08/04/2022    Antimicrobials this admission: 1/15 Cefepime >> x 7 days 1/15 Vancomycin >> x 7 days 1/15 Flagyl >> x 7 days  Microbiology results: 1/14 BCx: Pending 1/14 UCx: Pending   Thank you for allowing pharmacy to be a part of this patient's care.  Mirna Mires, Pharmacy Student  08/05/2022 8:42 AM

## 2022-08-05 NOTE — Progress Notes (Signed)
Progress Note   Updated pts mother Misty Apple via telephone regarding current plan of care and all questions answered.   Donell Beers, [place] Pager [phone] (please enter 7 digits) PCCM Consult Pager [phone] (please enter 7 digits)

## 2022-08-05 NOTE — Consult Note (Signed)
PHARMACY CONSULT NOTE - ELECTROLYTES  Pharmacy Consult for Electrolyte Monitoring and Replacement   Recent Labs: Potassium (mmol/L)  Date Value  08/05/2022 4.5  09/27/2013 3.4   Magnesium (mg/dL)  Date Value  08/05/2022 2.0   Calcium (mg/dL)  Date Value  08/05/2022 7.9 (L)   Calcium, Total (mg/dL)  Date Value  09/27/2013 8.9 (L)   Albumin (g/dL)  Date Value  08/05/2022 2.8 (L)  09/27/2013 4.7   Phosphorus (mg/dL)  Date Value  08/05/2022 2.9   Sodium (mmol/L)  Date Value  08/05/2022 133 (L)  09/27/2013 139   Corrected Ca: 8.9 mg/dL  Assessment  Brian Decker. is a 27 y.o. male presenting with cellulitis and drug overdose. PMH significant for PSA (cocaine, methamphetamine, heroin, marijuana), drug induced psychosis. Pharmacy has been consulted to monitor and replace electrolytes.  Diet: NPO   Goal of Therapy: Electrolytes WNL  Plan:  Sodium: 132 >> 133, stable, continue to monitor Potassium: 3.8 >> 4.5, no replacement needed Magnesium: 2.1 >> 2.0, no replacement needed Phosphorus: 3.6 >> 2.9, no replacement needed Check BMP, Mg, Phos with AM labs  Thank you for allowing pharmacy to be a part of this patient's care.  Gretel Acre, PharmD PGY1 Pharmacy Resident 08/05/2022 8:07 AM

## 2022-08-05 NOTE — Plan of Care (Signed)
  Problem: Safety: Goal: Violent Restraint(s) Outcome: Not Progressing   Problem: Education: Goal: Knowledge of General Education information will improve Description: Including pain rating scale, medication(s)/side effects and non-pharmacologic comfort measures Outcome: Not Progressing   Problem: Health Behavior/Discharge Planning: Goal: Ability to manage health-related needs will improve Outcome: Not Progressing   Problem: Clinical Measurements: Goal: Ability to maintain clinical measurements within normal limits will improve Outcome: Not Progressing Goal: Will remain free from infection Outcome: Not Progressing Goal: Diagnostic test results will improve Outcome: Not Progressing Goal: Respiratory complications will improve Outcome: Not Progressing Goal: Cardiovascular complication will be avoided Outcome: Not Progressing   Problem: Activity: Goal: Risk for activity intolerance will decrease Outcome: Not Progressing   Problem: Nutrition: Goal: Adequate nutrition will be maintained Outcome: Not Progressing   Problem: Coping: Goal: Level of anxiety will decrease Outcome: Not Progressing   Problem: Elimination: Goal: Will not experience complications related to bowel motility Outcome: Not Progressing Goal: Will not experience complications related to urinary retention Outcome: Not Progressing   Problem: Pain Managment: Goal: General experience of comfort will improve Outcome: Not Progressing   Problem: Safety: Goal: Ability to remain free from injury will improve Outcome: Not Progressing   Problem: Skin Integrity: Goal: Risk for impaired skin integrity will decrease Outcome: Not Progressing

## 2022-08-05 NOTE — Progress Notes (Signed)
Patient had a brief episode of seizure like activity described as myoclonic jerking movements of upper body with associated gaze deviation upward and outward per RN. Last documented episode was in 01/09/2020 when he presented to the ED with witnesses 2 episodes of seizure following heroine use.The patient had reported previously that he has a prior history of seizures although there is no such history documented in his chart. He is not on any medications for this.   Seizure like Activity -Initial CTH was negative, will hold off imaging at this time given no focal neurological deficit and activity lasting few seconds -Obtain EEG -Load with Keppra 1 g, followed by 500 mg BID maintance -Seizure precaution -Ativan/Versed PRN for breakthrough seizures -Neurology consult   Rufina Falco, DNP, CCRN, FNP-C, AGACNP-BC Acute Care & Family Nurse Practitioner  [facility]  See Amion for personal pager PCCM on call pager [phone] until 7 am

## 2022-08-05 NOTE — Progress Notes (Signed)
   08/05/22 1649  Assess: MEWS Score  Temp 98.9 F (37.2 C)  BP 123/80  MAP (mmHg) 92  Pulse Rate (!) 115  Resp 16  SpO2 100 %  O2 Device Room Air  Assess: MEWS Score  MEWS Temp 0  MEWS Systolic 0  MEWS Pulse 2  MEWS RR 0  MEWS LOC 0  MEWS Score 2  MEWS Score Color Yellow  Assess: if the MEWS score is Yellow or Red  Were vital signs taken at a resting state? Yes  Focused Assessment No change from prior assessment  Does the patient meet 2 or more of the SIRS criteria? No  MEWS guidelines implemented *See Row Information* Yes  Treat  MEWS Interventions Other (Comment)  Pain Scale 0-10  Pain Score 0  Take Vital Signs  Increase Vital Sign Frequency  Yellow: Q 2hr X 2 then Q 4hr X 2, if remains yellow, continue Q 4hrs  Notify: Charge Nurse/RN  Name of Charge Nurse/RN Notified [place]  Date Charge Nurse/RN Notified 08/05/22  Time Charge Nurse/RN Notified 1649  Provider Notification  Provider Name/Title Dr. Lanney Gins  Date Provider Notified 08/05/22  Time Provider Notified 5784  Document  Progress note created (see row info) Yes  Assess: SIRS CRITERIA  SIRS Temperature  0  SIRS Pulse 1  SIRS Respirations  0  SIRS WBC 1  SIRS Score Sum  2

## 2022-08-05 NOTE — Progress Notes (Addendum)
Pharmacy Antibiotic Note  Brian Decker. is a 27 y.o. male admitted on 08/03/2022 with cellulitis. PMH significant for PSA (cocaine, methamphetamine, heroin, marijuana), drug induced psychosis. Patient with multiple lesions concerning for cellulitis. Pharmacy has been consulted for cefepime, vancomycin.  Plan: Day 2 of antibiotics Continue vancomycin 750 mg IV Q12H. Goal AUC 400-550. Expected AUC: 447.6 Expected Css min: 12.1 SCr used: 1.05  Weight used: TBW (TBW<IBW) Vd used: 0.72 (BMI 18.6) Continue cefepime 2 g IV Q8H Patient is also on metronidazole 500 mg IV Q12H Continue to monitor renal function and follow culture results   Temp (24hrs), Avg:100.3 F (37.9 C), Min:99.1 F (37.3 C), Max:101.3 F (38.5 C)   Recent Labs  Lab 08/03/22 2237 08/03/22 2314 08/04/22 0345 08/05/22 0525  WBC 33.6*  --  21.8* 14.9*  CREATININE 1.89*  --  1.23 1.05  LATICACIDVEN  --  4.6* 1.3  --      Estimated Creatinine Clearance: 89.7 mL/min (by C-G formula based on SCr of 1.05 mg/dL).    Allergies  Allergen Reactions   Bee Venom Anaphylaxis   Penicillins Anaphylaxis    Per Mom     Antimicrobials this admission: 1/15 Cefepime >> 1/15 Vancomycin >> 1/15 Flagyl >>  Microbiology results: 1/14 BCx: NG<12H 1/14 UCx: IP  1/15 Wound Cx: GPC in pairs  Thank you for allowing pharmacy to be a part of this patient's care.  Gretel Acre, PharmD PGY1 Pharmacy Resident 08/05/2022 8:18 AM
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
## 2022-08-06 ENCOUNTER — Encounter: Payer: Self-pay | Admitting: Psychiatry

## 2022-08-06 ENCOUNTER — Other Ambulatory Visit: Payer: Self-pay

## 2022-08-06 ENCOUNTER — Inpatient Hospital Stay
Admission: AD | Admit: 2022-08-06 | Discharge: 2022-08-11 | DRG: 882 | Disposition: A | Discharge status: Home / Self Care | Payer: 59 | Source: Intra-hospital | Attending: Psychiatry | Admitting: Psychiatry

## 2022-08-06 DIAGNOSIS — F1910 Other psychoactive substance abuse, uncomplicated: Secondary | ICD-10-CM

## 2022-08-06 DIAGNOSIS — T50902A Poisoning by unspecified drugs, medicaments and biological substances, intentional self-harm, initial encounter: Secondary | ICD-10-CM

## 2022-08-06 DIAGNOSIS — Z20822 Contact with and (suspected) exposure to covid-19: Secondary | ICD-10-CM | POA: Diagnosis present

## 2022-08-06 DIAGNOSIS — R4689 Other symptoms and signs involving appearance and behavior: Secondary | ICD-10-CM

## 2022-08-06 DIAGNOSIS — K219 Gastro-esophageal reflux disease without esophagitis: Secondary | ICD-10-CM | POA: Diagnosis present

## 2022-08-06 DIAGNOSIS — F1490 Cocaine use, unspecified, uncomplicated: Secondary | ICD-10-CM | POA: Diagnosis present

## 2022-08-06 DIAGNOSIS — F1510 Other stimulant abuse, uncomplicated: Secondary | ICD-10-CM | POA: Insufficient documentation

## 2022-08-06 DIAGNOSIS — Z880 Allergy status to penicillin: Secondary | ICD-10-CM

## 2022-08-06 DIAGNOSIS — F17210 Nicotine dependence, cigarettes, uncomplicated: Secondary | ICD-10-CM | POA: Diagnosis present

## 2022-08-06 DIAGNOSIS — R45851 Suicidal ideations: Secondary | ICD-10-CM | POA: Diagnosis present

## 2022-08-06 DIAGNOSIS — L03116 Cellulitis of left lower limb: Secondary | ICD-10-CM

## 2022-08-06 DIAGNOSIS — F4325 Adjustment disorder with mixed disturbance of emotions and conduct: Secondary | ICD-10-CM | POA: Diagnosis present

## 2022-08-06 DIAGNOSIS — R451 Restlessness and agitation: Secondary | ICD-10-CM | POA: Diagnosis present

## 2022-08-06 DIAGNOSIS — M797 Fibromyalgia: Secondary | ICD-10-CM | POA: Diagnosis present

## 2022-08-06 DIAGNOSIS — F322 Major depressive disorder, single episode, severe without psychotic features: Secondary | ICD-10-CM | POA: Insufficient documentation

## 2022-08-06 LAB — RESP PANEL BY RT-PCR (RSV, FLU A&B, COVID)  RVPGX2
Influenza A by PCR: NEGATIVE
Influenza B by PCR: NEGATIVE
Resp Syncytial Virus by PCR: NEGATIVE
SARS Coronavirus 2 by RT PCR: NEGATIVE

## 2022-08-06 LAB — COMPREHENSIVE METABOLIC PANEL
ALT: 66 U/L — ABNORMAL HIGH (ref 0–44)
AST: 113 U/L — ABNORMAL HIGH (ref 15–41)
Albumin: 2.9 g/dL — ABNORMAL LOW (ref 3.5–5.0)
Alkaline Phosphatase: 137 U/L — ABNORMAL HIGH (ref 38–126)
Anion gap: 8 (ref 5–15)
BUN: 11 mg/dL (ref 6–20)
CO2: 25 mmol/L (ref 22–32)
Calcium: 8.1 mg/dL — ABNORMAL LOW (ref 8.9–10.3)
Chloride: 102 mmol/L (ref 98–111)
Creatinine, Ser: 0.84 mg/dL (ref 0.61–1.24)
GFR, Estimated: >60 mL/min (ref >60)
Glucose, Bld: 97 mg/dL (ref 70–99)
Potassium: 3.4 mmol/L — ABNORMAL LOW (ref 3.5–5.1)
Sodium: 135 mmol/L (ref 135–145)
Total Bilirubin: 0.7 mg/dL (ref 0.3–1.2)
Total Protein: 5.8 g/dL — ABNORMAL LOW (ref 6.5–8.1)

## 2022-08-06 LAB — HEPATITIS PANEL, ACUTE
HCV Ab: REACTIVE — AB
Hep A IgM: NONREACTIVE
Hep B C IgM: NONREACTIVE
Hepatitis B Surface Ag: NONREACTIVE

## 2022-08-06 LAB — CBC
HCT: 30.7 % — ABNORMAL LOW (ref 39.0–52.0)
Hemoglobin: 10.4 g/dL — ABNORMAL LOW (ref 13.0–17.0)
MCH: 30 pg (ref 26.0–34.0)
MCHC: 33.9 g/dL (ref 30.0–36.0)
MCV: 88.5 fL (ref 80.0–100.0)
Platelets: 257 10*3/uL (ref 150–400)
RBC: 3.47 MIL/uL — ABNORMAL LOW (ref 4.22–5.81)
RDW: 13.1 % (ref 11.5–15.5)
WBC: 14.6 10*3/uL — ABNORMAL HIGH (ref 4.0–10.5)
nRBC: 0 % (ref 0.0–0.2)

## 2022-08-06 LAB — PHOSPHORUS: Phosphorus: 3.2 mg/dL (ref 2.5–4.6)

## 2022-08-06 LAB — MAGNESIUM: Magnesium: 1.8 mg/dL (ref 1.7–2.4)

## 2022-08-06 MED ORDER — OXYCODONE-ACETAMINOPHEN 5-325 MG PO TABS
1.0000 | ORAL_TABLET | ORAL | Status: AC
  Administered 2022-08-06: 1 via ORAL
  Filled 2022-08-06: qty 1

## 2022-08-06 MED ORDER — ENSURE ENLIVE PO LIQD: 237.0000 mL | Freq: Three times a day (TID) | ORAL | 12 refills | Status: DC

## 2022-08-06 MED ORDER — POTASSIUM CHLORIDE CRYS ER 20 MEQ PO TBCR
40.0000 meq | EXTENDED_RELEASE_TABLET | Freq: Once | ORAL | Status: AC
  Administered 2022-08-06: 40 meq via ORAL
  Filled 2022-08-06: qty 2

## 2022-08-06 MED ORDER — TRAZODONE HCL 100 MG PO TABS
100.0000 mg | ORAL_TABLET | Freq: Every evening | ORAL | Status: DC | PRN
  Administered 2022-08-06 – 2022-08-07 (×2): 100 mg via ORAL
  Filled 2022-08-06 (×2): qty 1

## 2022-08-06 MED ORDER — HYDROXYZINE HCL 50 MG PO TABS
50.0000 mg | ORAL_TABLET | Freq: Three times a day (TID) | ORAL | Status: DC | PRN
  Administered 2022-08-06 – 2022-08-10 (×6): 50 mg via ORAL
  Filled 2022-08-06 (×6): qty 1

## 2022-08-06 MED ORDER — FAMOTIDINE 20 MG PO TABS
20.0000 mg | ORAL_TABLET | Freq: Two times a day (BID) | ORAL | Status: DC
  Administered 2022-08-06 – 2022-08-11 (×8): 20 mg via ORAL
  Filled 2022-08-06 (×10): qty 1

## 2022-08-06 MED ORDER — ENSURE ENLIVE PO LIQD: 237.0000 mL | Freq: Three times a day (TID) | ORAL | Status: DC

## 2022-08-06 MED ORDER — ADULT MULTIVITAMIN W/MINERALS CH: 1.0000 | ORAL_TABLET | Freq: Every day | ORAL | Status: DC

## 2022-08-06 MED ORDER — DOXYCYCLINE HYCLATE 100 MG PO TABS
100.0000 mg | ORAL_TABLET | Freq: Two times a day (BID) | ORAL | Status: AC
  Administered 2022-08-07 – 2022-08-08 (×4): 100 mg via ORAL
  Filled 2022-08-06 (×5): qty 1

## 2022-08-06 MED ORDER — DOXYCYCLINE HYCLATE 100 MG PO TABS: 100.0000 mg | ORAL_TABLET | Freq: Two times a day (BID) | ORAL | 0 refills | Status: DC

## 2022-08-06 MED ORDER — SODIUM CHLORIDE 0.9 % IV SOLN
100.0000 mg | Freq: Two times a day (BID) | INTRAVENOUS | Status: AC
  Administered 2022-08-06: 100 mg via INTRAVENOUS
  Filled 2022-08-06: qty 100

## 2022-08-06 MED ORDER — ACETAMINOPHEN 325 MG PO TABS
650.0000 mg | ORAL_TABLET | Freq: Four times a day (QID) | ORAL | Status: DC | PRN
  Administered 2022-08-07 – 2022-08-10 (×6): 650 mg via ORAL
  Filled 2022-08-06 (×7): qty 2

## 2022-08-06 MED ORDER — VITAMIN C 500 MG PO TABS: 500.0000 mg | ORAL_TABLET | Freq: Two times a day (BID) | ORAL | Status: DC

## 2022-08-06 MED ORDER — ALUM & MAG HYDROXIDE-SIMETH 200-200-20 MG/5ML PO SUSP: 30.0000 mL | ORAL | Status: DC | PRN

## 2022-08-06 MED ORDER — DOXYCYCLINE HYCLATE 100 MG PO TABS: 100.0000 mg | ORAL_TABLET | Freq: Two times a day (BID) | ORAL | Status: DC

## 2022-08-06 MED ORDER — VITAMIN B-1 100 MG PO TABS: 100.0000 mg | ORAL_TABLET | Freq: Every day | ORAL | Status: DC

## 2022-08-06 MED ORDER — MAGNESIUM HYDROXIDE 400 MG/5ML PO SUSP: 30.0000 mL | Freq: Every day | ORAL | Status: DC | PRN

## 2022-08-06 MED ORDER — FOLIC ACID 1 MG PO TABS: 1.0000 mg | ORAL_TABLET | Freq: Every day | ORAL | Status: DC

## 2022-08-06 MED ORDER — VITAMIN C 500 MG PO TABS
500.0000 mg | ORAL_TABLET | Freq: Two times a day (BID) | ORAL | Status: DC
  Administered 2022-08-07 – 2022-08-11 (×7): 500 mg via ORAL
  Filled 2022-08-06 (×11): qty 1

## 2022-08-06 NOTE — Progress Notes (Addendum)
Nutrition Follow Up Note   DOCUMENTATION CODES:   Not applicable  INTERVENTION:   Ensure Enlive po TID, each supplement provides 350 kcal and 20 grams of protein.  MVI po daily   Vitamin C 500mg  po BID   NUTRITION DIAGNOSIS:   Inadequate oral intake related to inability to eat (pt sedated and ventilated) as evidenced by NPO status. -resolved   GOAL:   Patient will meet greater than or equal to 90% of their needs -progressing   MONITOR:   PO intake, Supplement acceptance, Labs, Weight trends, I & O's, Skin  ASSESSMENT:   27 y/o male with h/o asthma and substance abuse who is admitted with OD, cellulitis, AMS and agitation.  Pt extubated 1/16. Pt initiated on a regular diet 1/16. Pt is eating 100% of meals. RD will add supplements to help pt meet his estimated needs. Per chart, pt is weight stable since admission.   Medications reviewed and include: doxycycline, lovenox, pepcid, folic acid, MVI, thiamine   Labs reviewed: K 3.4(L), P 3.2 wnl, Mg 1.8 wnl Wbc- 14.6(H), Hgb 10.4(L), Hct 30.7(L)  Diet Order:   Diet Order             Diet regular Room service appropriate? Yes; Fluid consistency: Thin  Diet effective now                  EDUCATION NEEDS:   No education needs have been identified at this time  Skin:  Skin Assessment: Reviewed RN Assessment (multiple small wounds extremities, cellulitis)  Last BM:  1/16- type 7  Height:   Ht Readings from Last 1 Encounters:  07/27/22 5\' 10"  (1.778 m)    Weight:   Wt Readings from Last 1 Encounters:  08/06/22 59.3 kg    Ideal Body Weight:  75.45 kg  BMI:  Body mass index is 18.76 kg/m.  Estimated Nutritional Needs:   Kcal:  2000-2300kcal/day  Protein:  100-115g/day  Fluid:  1.9-2.2L/day  Koleen Distance MS, RD, LDN Please refer to [facility] for RD and/or RD on-call/weekend/after hours pager

## 2022-08-06 NOTE — TOC CM/SW Note (Addendum)
CSW acknowledges consult for [place] resources and applying for Medicaid. Patient is not fully oriented. Will follow progress and offer resources when appropriate. Per chart review, plan to transfer to BMU when medically stable for discharge. Sent secure email to financial counselor regarding interest in applying for Medicaid.  Dayton Scrape, [place]  9:09 am: Received response from financial counselor that she has assigned case to [place], [place]  1:59 pm: Patient has orders to discharge to BMU today. No further concerns. CSW signing off.  Dayton Scrape, [place]

## 2022-08-06 NOTE — BH Assessment (Signed)
Patient is to be admitted to [facility] BMU today 08/06/22 by Dr. Weber Cooks.  Attending Physician will be Dr.  Weber Cooks .   Patient has been assigned to room 309, by Potter, Nicole Kindred.     ER staff is aware of the admission: Seth Bake,  Patient Access.

## 2022-08-06 NOTE — Progress Notes (Signed)
Patient ID: Brian Decker., male   DOB: June 13, 1996, 27 y.o.   MRN: [id number] Admission note: Patient is a 27 year old male, presents IVC per report of severe major depression, single episode, without psychotic features. Patient is alert and oriented to unit. Patients affect is appropriate to circumstance. Patient motor activity is fidgety during assessment.  Patient is cooperative during assessment questions. Patient presents with several small abrasions along hands and feet and a wrapped lower left leg due to cellulitis. Patient states that his admission to the hospital is because "I did bad dope and blacked out. I don't really know what happened and I ended up in the hospital."   Patient currently denies SI/HI/AVH. Patient states that his two goals while being on the unit is "self love and self care." Unit policies explained and verbalized understanding. Q15 minute checks maintained and will continue to monitor.

## 2022-08-06 NOTE — Tx Team (Signed)
Initial Treatment Plan 08/06/2022 6:46 PM Brian Decker. OBS:[id number]    PATIENT STRESSORS: Substance abuse     PATIENT STRENGTHS: Motivation for treatment/growth    PATIENT IDENTIFIED PROBLEMS: Substance abuse                     DISCHARGE CRITERIA:  Improved stabilization in mood, thinking, and/or behavior  PRELIMINARY DISCHARGE PLAN: Placement in alternative living arrangements  PATIENT/FAMILY INVOLVEMENT: This treatment plan has been presented to and reviewed with the patient, Brian Decker.. The patient and family have been given the opportunity to ask questions and make suggestions.  Donette Larry, RN 08/06/2022, 6:46 PM

## 2022-08-06 NOTE — Discharge Summary (Addendum)
Physician Discharge Summary   Patient: Brian Decker. MRN: [id number] DOB: [birth year]  Admit date:     08/03/2022  Discharge date: 08/06/22  Discharge Physician: Delfino Lovett   PCP: Pcp, No   Recommendations at discharge:   Moving to inpatient behavioral medicine under IVC Will need close monitoring of liver functions Outpatient GI and PCP follow-up for hep C and liver function  Discharge Diagnoses: Principal Problem:   Drug overdose Active Problems:   Combative behavior   Cellulitis of left lower extremity   Substance abuse [facility])  Hospital Course: Assessment and Plan: 27 y.o male with significant PMH of polysubstance abuse including cocaine, methamphetamine, heroin, and marijuana with multiple admissions for drug overdose, asthma, cellulitis who presented to the ED with chief complaints of altered mental status, and severe agitation.   08/04/22: Admitted to ICU with altered mental status in the setting of drug overdose and was intubated on ventilator 08/05/22- patient s/p liberation from MV, he is tearful and remorseful for abusing illicit drugs.  08/06/22: On TRH service.  Medically clear and is being transferred to inpatient behavioral medicine  # Acute respiratory failure with hypoxia due to sedation from severe agitation -now resolved Initially intubated for airway protection - liberated from MV to nasal canula and now on room air   # Acute toxic metabolic encephalopathy-RESOLVED # Drug Overdose Background history of drug abuse with multiple episodes and admission for drug overdose. UDS positive for Cocaine, Amphetamine, and Marijuana. CTH negative.   # Sepsis-RULED OUT  # Left lower extremity cellulitis Continue doxycycline for 5 days He does have open wound (likely needle track) draining pus & blood at times.    # AKI  # Anion gap metabolic acidosis with Lactic Acidosis -Now resolved   # Transaminitis Hx of polysubstance abuse ETOH negative -Hep C  positive -Encourage oral hydration and outpatient follow-up with GI for hep C management.  Encouraged cessation of alcohol   # Hypoglycemia -Now resolved -Encourage oral nutrition       Consultants: Psychiatry Disposition:  Inpatient behavioral medicine Diet recommendation:  Discharge Diet Orders (From admission, onward)     Start     Ordered   08/06/22 0000  Diet - low sodium heart healthy        08/06/22 1312           Regular diet DISCHARGE MEDICATION: Allergies as of 08/06/2022       Reactions   Bee Venom Anaphylaxis   Penicillins Anaphylaxis   Per Mom, tolerates cefepime 08/04/2022        Medication List     TAKE these medications    doxycycline 100 MG tablet Commonly known as: VIBRA-TABS Take 1 tablet (100 mg total) by mouth every 12 (twelve) hours for 5 days.   feeding supplement Liqd Take 237 mLs by mouth 3 (three) times daily between meals.   folic acid 1 MG tablet Commonly known as: FOLVITE Take 1 tablet (1 mg total) by mouth daily. Start taking on: August 07, 2022   multivitamin with minerals Tabs tablet Take 1 tablet by mouth daily. Start taking on: August 07, 2022   thiamine 100 MG tablet Commonly known as: Vitamin B-1 Take 1 tablet (100 mg total) by mouth daily. Start taking on: August 07, 2022        Discharge Exam: Ceasar Mons Weights   08/05/22 0437 08/06/22 0455  Weight: 59 kg 36.39 kg   27 year old male lying in the bed agitated but in no acute distress, sitter  at bedside Lungs clear to auscultation bilaterally Cardiovascular regular rate and rhythm Abdomen soft, benign Neuro alert and awake, nonfocal Psych: Disheveled, anxious and agitated, restless Skin/extremities: See pictures below                Condition at discharge: fair  The results of significant diagnostics from this hospitalization (including imaging, microbiology, ancillary and laboratory) are listed below for reference.   Imaging Studies: EEG  adult  Result Date: 08/05/2022 Charlsie QuestYadav, Priyanka O, MD     08/05/2022  1:58 PM Patient Name: Brian KerbsJason Juenger Jr. MRN: [id number] Epilepsy Attending: Charlsie QuestPriyanka O Yadav Referring Physician/Provider: Jimmye Normanuma, Elizabeth Achieng, NP Date: 08/05/2022 Duration: 25 mins Patient history: 27 year old male with altered mental status.  EEG to evaluate for seizure. Level of alertness: Awake, asleep AEDs during EEG study: Keppra, Versed Technical aspects: This EEG study was done with scalp electrodes positioned according to the 10-20 International system of electrode placement. Electrical activity was reviewed with band pass filter of 1-70Hz , sensitivity of 7 uV/mm, display speed of 1530mm/sec with a 60Hz  notched filter applied as appropriate. EEG data were recorded continuously and digitally stored.  Video monitoring was available and reviewed as appropriate. Description: During awake state, no clear posterior dominant rhythm was seen. Sleep was characterized by vertex waves, sleep spindles (12 to 14 Hz), maximal frontocentral region.  EEG showed continuous generalized 2 to 3 Hz delta slowing with overriding 12 to 15 Hz beta activity.  Hyperventilation and photic stimulation were not performed.   ABNORMALITY - Continuous slow, generalized - Excessive beta, generalized IMPRESSION: This study is suggestive of moderate diffuse encephalopathy, nonspecific etiology.  The excessive beta activity is most likely due to effect of benzodiazepines and is a benign EEG pattern.  No seizures or epileptiform discharges were seen throughout the recording. Charlsie Questriyanka O Yadav   ECHOCARDIOGRAM COMPLETE  Result Date: 08/04/2022    ECHOCARDIOGRAM REPORT   Patient Name:   Brian KerbsJason Howson Jr. Date of Exam: 08/04/2022 Medical Rec #:  [id number]        Height:       70.0 in Accession #:    [id number]       Weight:       140.0 lb Date of Birth:  09/16/1995         BSA:          1.794 m Patient Age:    26 years         BP:           116/72 mmHg Patient Gender: M                 HR:           113 bpm. Exam Location:  [facility] Procedure: 2D Echo, Color Doppler and Cardiac Doppler Indications:     Endocarditis I38  History:         Patient has no prior history of Echocardiogram examinations.                  Cocaine abuse, heroin abuse.  Sonographer:     Cristela BlueJerry Hege Referring Phys:  [id number] Hubbard HartshornELIZABETH ACHIENG OUMA Diagnosing Phys: Lorine BearsMuhammad Arida MD  Sonographer Comments: Echo performed with patient supine and on artificial respirator. Image acquisition challenging due to patient body habitus. IMPRESSIONS  1. Left ventricular ejection fraction, by estimation, is 60 to 65%. The left ventricle has normal function. The left ventricle has no regional wall motion abnormalities. Left ventricular diastolic parameters were normal.  2. Right ventricular  systolic function is normal. The right ventricular size is normal. Tricuspid regurgitation signal is inadequate for assessing PA pressure.  3. The mitral valve is normal in structure. No evidence of mitral valve regurgitation. No evidence of mitral stenosis.  4. The aortic valve is normal in structure. Aortic valve regurgitation is not visualized. No aortic stenosis is present. Conclusion(s)/Recommendation(s): Normal biventricular function without evidence of hemodynamically significant valvular heart disease. FINDINGS  Left Ventricle: Left ventricular ejection fraction, by estimation, is 60 to 65%. The left ventricle has normal function. The left ventricle has no regional wall motion abnormalities. The left ventricular internal cavity size was normal in size. There is  no left ventricular hypertrophy. Left ventricular diastolic parameters were normal. Right Ventricle: The right ventricular size is normal. No increase in right ventricular wall thickness. Right ventricular systolic function is normal. Tricuspid regurgitation signal is inadequate for assessing PA pressure. Left Atrium: Left atrial size was normal in size. Right Atrium: Right atrial size  was normal in size. Pericardium: There is no evidence of pericardial effusion. Mitral Valve: The mitral valve is normal in structure. No evidence of mitral valve regurgitation. No evidence of mitral valve stenosis. Tricuspid Valve: The tricuspid valve is normal in structure. Tricuspid valve regurgitation is not demonstrated. No evidence of tricuspid stenosis. Aortic Valve: The aortic valve is normal in structure. Aortic valve regurgitation is not visualized. No aortic stenosis is present. Aortic valve mean gradient measures 4.0 mmHg. Aortic valve peak gradient measures 6.6 mmHg. Aortic valve area, by VTI measures 2.82 cm. Pulmonic Valve: The pulmonic valve was normal in structure. Pulmonic valve regurgitation is not visualized. No evidence of pulmonic stenosis. Aorta: The aortic root is normal in size and structure. Venous: The inferior vena cava was not well visualized. IAS/Shunts: No atrial level shunt detected by color flow Doppler.  LEFT VENTRICLE PLAX 2D LVIDd:         4.10 cm   Diastology LVIDs:         2.70 cm   LV e' medial:    9.68 cm/s LV PW:         1.00 cm   LV E/e' medial:  10.5 LV IVS:        1.00 cm   LV e' lateral:   12.50 cm/s LVOT diam:     2.00 cm   LV E/e' lateral: 8.2 LV SV:         50 LV SV Index:   28 LVOT Area:     3.14 cm  RIGHT VENTRICLE RV Basal diam:  3.60 cm RV Mid diam:    3.00 cm LEFT ATRIUM           Index LA diam:      1.90 cm 1.06 cm/m LA Vol (A4C): 8.7 ml  4.86 ml/m  AORTIC VALVE AV Area (Vmax):    2.77 cm AV Area (Vmean):   2.59 cm AV Area (VTI):     2.82 cm AV Vmax:           128.00 cm/s AV Vmean:          92.800 cm/s AV VTI:            0.178 m AV Peak Grad:      6.6 mmHg AV Mean Grad:      4.0 mmHg LVOT Vmax:         113.00 cm/s LVOT Vmean:        76.500 cm/s LVOT VTI:  0.160 m LVOT/AV VTI ratio: 0.90  AORTA Ao Root diam: 2.80 cm MITRAL VALVE                TRICUSPID VALVE MV Area (PHT): 4.15 cm     TR Peak grad:   17.1 mmHg MV Decel Time: 183 msec     TR Vmax:         207.00 cm/s MV E velocity: 102.00 cm/s MV A velocity: 93.80 cm/s   SHUNTS MV E/A ratio:  1.09         Systemic VTI:  0.16 m                             Systemic Diam: 2.00 cm Lorine Bears MD Electronically signed by Lorine Bears MD Signature Date/Time: 08/04/2022/4:30:26 PM    Final    DG Abd 1 View  Result Date: 08/04/2022 CLINICAL DATA:  [id number] Encounter for orogastric (OG) tube placement [id number] EXAM: ABDOMEN - 1 VIEW COMPARISON:  Radiograph 08/04/2022 FINDINGS: Nasogastric tube tip and side port overlie the stomach, tip at the fundus. No evidence of bowel obstruction. Mild stool burden. IMPRESSION: Nasogastric tube tip and side port overlie the stomach. Electronically Signed   By: Caprice Renshaw M.D.   On: 08/04/2022 10:49   CT EXTREMITY LOWER LEFT W CONTRAST  Result Date: 08/04/2022 CLINICAL DATA:  Soft tissue infection suspected, lower leg, no prior imaging EXAM: CT OF THE LOWER LEFT EXTREMITY WITH CONTRAST TECHNIQUE: Multidetector CT imaging of the lower left extremity was performed according to the standard protocol following intravenous contrast administration. RADIATION DOSE REDUCTION: This exam was performed according to the departmental dose-optimization program which includes automated exposure control, adjustment of the mA and/or kV according to patient size and/or use of iterative reconstruction technique. CONTRAST:  OMNIPAQUE IOHEXOL 300 MG/ML  SOLN COMPARISON:  None Available. FINDINGS: Bones/Joint/Cartilage No evidence of fracture, dislocation, or joint effusion. No evidence of severe arthropathy. No cortical erosion or destruction. No aggressive appearing focal bone abnormality. Ligaments Suboptimally assessed by CT. Muscles and Tendons Grossly unremarkable. Soft tissues Dermal thickening with associated subcutaneus soft tissue edema of the anterolateral leg. Nonenhancing simple fluid superficial to the peroneal musculature consistent with edema. No peripherally enhancing  organized fluid collection. No subcutaneus soft tissue emphysema. IMPRESSION: 1. Dermal thickening with associated subcutaneus soft tissue edema of the anterolateral leg. Correlate with signs and symptoms of cellulitis. No subcutaneus soft tissue emphysema; however, please note necrotizing fasciitis cannot be excluded as this is a clinical diagnosis. 2. No abscess formation. 3. No CT finding of osteomyelitis. 4.  No acute displaced fracture or dislocation. Electronically Signed   By: Tish Frederickson M.D.   On: 08/04/2022 03:08   CT HEAD WO CONTRAST ( )  Result Date: 08/04/2022 CLINICAL DATA:  Head trauma, abnormal mental status (Age 31-64y) EXAM: CT HEAD WITHOUT CONTRAST TECHNIQUE: Contiguous axial images were obtained from the base of the skull through the vertex without intravenous contrast. RADIATION DOSE REDUCTION: This exam was performed according to the departmental dose-optimization program which includes automated exposure control, adjustment of the mA and/or kV according to patient size and/or use of iterative reconstruction technique. COMPARISON:  CT head 01/09/2020 FINDINGS: Brain: No evidence of large-territorial acute infarction. No parenchymal hemorrhage. No mass lesion. No extra-axial collection. No mass effect or midline shift. No hydrocephalus. Basilar cisterns are patent. Vascular: No hyperdense vessel. Skull: No acute fracture or focal lesion. Sinuses/Orbits: Paranasal sinuses and mastoid air  cells are clear. The orbits are unremarkable. Other: None. IMPRESSION: No acute intracranial abnormality. Electronically Signed   By: Iven Finn M.D.   On: 08/04/2022 02:57   DG Chest Portable 1 View  Result Date: 08/04/2022 CLINICAL DATA:  Intubation, OG tube placement EXAM: PORTABLE CHEST 1 VIEW COMPARISON:  08/03/2022 FINDINGS: Endotracheal tube is 7.7 cm above the carina. OG tube is in the stomach with the side port near the GE junction. Heart and mediastinal contours are within normal  limits. No focal opacities or effusions. No acute bony abnormality. IMPRESSION: Support devices as above. No active cardiopulmonary disease. Electronically Signed   By: Rolm Baptise M.D.   On: 08/04/2022 01:53   DG Chest Port 1 View  Result Date: 08/03/2022 CLINICAL DATA:  Left leg erythema, infection, possible sepsis EXAM: PORTABLE CHEST 1 VIEW COMPARISON:  None Available. FINDINGS: The heart size and mediastinal contours are within normal limits. Both lungs are clear. The visualized skeletal structures are unremarkable. IMPRESSION: No active disease. Electronically Signed   By: Randa Ngo M.D.   On: 08/03/2022 23:36   CT HEAD WO CONTRAST (5MM)  Result Date: 07/27/2022 CLINICAL DATA:  Head trauma, minor (Age >= 65y); Neck trauma (Age >= 65y). Drug overdose EXAM: CT HEAD WITHOUT CONTRAST CT CERVICAL SPINE WITHOUT CONTRAST TECHNIQUE: Multidetector CT imaging of the head and cervical spine was performed following the standard protocol without intravenous contrast. Multiplanar CT image reconstructions of the cervical spine were also generated. RADIATION DOSE REDUCTION: This exam was performed according to the departmental dose-optimization program which includes automated exposure control, adjustment of the mA and/or kV according to patient size and/or use of iterative reconstruction technique. COMPARISON:  None Available. FINDINGS: CT HEAD FINDINGS Brain: Normal anatomic configuration. No abnormal intra or extra-axial mass lesion or fluid collection. No abnormal mass effect or midline shift. No evidence of acute intracranial hemorrhage or infarct. Ventricular size is normal. Cerebellum unremarkable. Vascular: Unremarkable Skull: Intact Sinuses/Orbits: Paranasal sinuses are clear. Orbits are unremarkable. Other: Mastoid air cells and middle ear cavities are clear. CT CERVICAL SPINE FINDINGS Alignment: Normal. Skull base and vertebrae: No acute fracture. No primary bone lesion or focal pathologic process.  Soft tissues and spinal canal: No prevertebral fluid or swelling. No visible canal hematoma. Disc levels: Intervertebral disc heights are preserved. Prevertebral soft tissues are not thickened on sagittal reformats. Spinal canal is widely patent. No significant neuroforaminal narrowing. Upper chest: Biapical pulmonary for infiltrates are identified within the visualized lung apices. Other: None IMPRESSION: 1. No acute intracranial abnormality. No calvarial fracture. 2. No acute fracture or listhesis of the cervical spine. 3. Biapical pulmonary infiltrates within the visualized lung apices. Electronically Signed   By: Fidela Salisbury M.D.   On: 07/27/2022 02:46   CT Cervical Spine Wo Contrast  Result Date: 07/27/2022 CLINICAL DATA:  Head trauma, minor (Age >= 65y); Neck trauma (Age >= 65y). Drug overdose EXAM: CT HEAD WITHOUT CONTRAST CT CERVICAL SPINE WITHOUT CONTRAST TECHNIQUE: Multidetector CT imaging of the head and cervical spine was performed following the standard protocol without intravenous contrast. Multiplanar CT image reconstructions of the cervical spine were also generated. RADIATION DOSE REDUCTION: This exam was performed according to the departmental dose-optimization program which includes automated exposure control, adjustment of the mA and/or kV according to patient size and/or use of iterative reconstruction technique. COMPARISON:  None Available. FINDINGS: CT HEAD FINDINGS Brain: Normal anatomic configuration. No abnormal intra or extra-axial mass lesion or fluid collection. No abnormal mass effect or midline shift.  No evidence of acute intracranial hemorrhage or infarct. Ventricular size is normal. Cerebellum unremarkable. Vascular: Unremarkable Skull: Intact Sinuses/Orbits: Paranasal sinuses are clear. Orbits are unremarkable. Other: Mastoid air cells and middle ear cavities are clear. CT CERVICAL SPINE FINDINGS Alignment: Normal. Skull base and vertebrae: No acute fracture. No primary bone  lesion or focal pathologic process. Soft tissues and spinal canal: No prevertebral fluid or swelling. No visible canal hematoma. Disc levels: Intervertebral disc heights are preserved. Prevertebral soft tissues are not thickened on sagittal reformats. Spinal canal is widely patent. No significant neuroforaminal narrowing. Upper chest: Biapical pulmonary for infiltrates are identified within the visualized lung apices. Other: None IMPRESSION: 1. No acute intracranial abnormality. No calvarial fracture. 2. No acute fracture or listhesis of the cervical spine. 3. Biapical pulmonary infiltrates within the visualized lung apices. Electronically Signed   By: Fidela Salisbury M.D.   On: 07/27/2022 02:46   DG Chest Portable 1 View  Result Date: 07/27/2022 CLINICAL DATA:  Shortness of breath, possible overdose EXAM: PORTABLE CHEST 1 VIEW COMPARISON:  None Available. FINDINGS: Cardiac and mediastinal contours are within normal limits. No focal pulmonary opacity. No pleural effusion or pneumothorax. No acute osseous abnormality. IMPRESSION: No acute cardiopulmonary process. Electronically Signed   By: Merilyn Baba M.D.   On: 07/27/2022 02:34    Microbiology: Results for orders placed or performed during the hospital encounter of 08/03/22  Resp panel by RT-PCR (RSV, Flu A&B, Covid) Anterior Nasal Swab     Status: None   Collection Time: 08/03/22 11:14 PM   Specimen: Anterior Nasal Swab  Result Value Ref Range Status   SARS Coronavirus 2 by RT PCR NEGATIVE NEGATIVE Final    Comment: (NOTE) SARS-CoV-2 target nucleic acids are NOT DETECTED.  The SARS-CoV-2 RNA is generally detectable in upper respiratory specimens during the acute phase of infection. The lowest concentration of SARS-CoV-2 viral copies this assay can detect is 138 copies/mL. A negative result does not preclude SARS-Cov-2 infection and should not be used as the sole basis for treatment or other patient management decisions. A negative result may  occur with  improper specimen collection/handling, submission of specimen other than nasopharyngeal swab, presence of viral mutation(s) within the areas targeted by this assay, and inadequate number of viral copies(<138 copies/mL). A negative result must be combined with clinical observations, patient history, and epidemiological information. The expected result is Negative.  Fact Sheet for Patients:  EntrepreneurPulse.com.au  Fact Sheet for Healthcare Providers:  IncredibleEmployment.be  This test is no t yet approved or cleared by the [place] FDA and  has been authorized for detection and/or diagnosis of SARS-CoV-2 by FDA under an Emergency Use Authorization (EUA). This EUA will remain  in effect (meaning this test can be used) for the duration of the COVID-19 declaration under Section 564(b)(1) of the Act, 21 U.S.C.section 360bbb-3(b)(1), unless the authorization is terminated  or revoked sooner.       Influenza A by PCR NEGATIVE NEGATIVE Final   Influenza B by PCR NEGATIVE NEGATIVE Final    Comment: (NOTE) The Xpert Xpress SARS-CoV-2/FLU/RSV plus assay is intended as an aid in the diagnosis of influenza from Nasopharyngeal swab specimens and should not be used as a sole basis for treatment. Nasal washings and aspirates are unacceptable for Xpert Xpress SARS-CoV-2/FLU/RSV testing.  Fact Sheet for Patients: EntrepreneurPulse.com.au  Fact Sheet for Healthcare Providers: IncredibleEmployment.be  This test is not yet approved or cleared by the [place] FDA and has been authorized for detection and/or diagnosis  of SARS-CoV-2 by FDA under an Emergency Use Authorization (EUA). This EUA will remain in effect (meaning this test can be used) for the duration of the COVID-19 declaration under Section 564(b)(1) of the Act, 21 U.S.C. section 360bbb-3(b)(1), unless the authorization is terminated  or revoked.     Resp Syncytial Virus by PCR NEGATIVE NEGATIVE Final    Comment: (NOTE) Fact Sheet for Patients: BloggerCourse.com  Fact Sheet for Healthcare Providers: SeriousBroker.it  This test is not yet approved or cleared by the [place] FDA and has been authorized for detection and/or diagnosis of SARS-CoV-2 by FDA under an Emergency Use Authorization (EUA). This EUA will remain in effect (meaning this test can be used) for the duration of the COVID-19 declaration under Section 564(b)(1) of the Act, 21 U.S.C. section 360bbb-3(b)(1), unless the authorization is terminated or revoked.  Performed at [facility], [address] [place], [place] [address]   Blood Culture (routine x 2)     Status: None (Preliminary result)   Collection Time: 08/03/22 11:14 PM   Specimen: BLOOD  Result Value Ref Range Status   Specimen Description BLOOD BLOOD RIGHT HAND  Final   Special Requests   Final    BOTTLES DRAWN AEROBIC AND ANAEROBIC Blood Culture results may not be optimal due to an excessive volume of blood received in culture bottles   Culture   Final    NO GROWTH 3 DAYS Performed at [facility], [address], [place], [place] [address]    Report Status PENDING  Incomplete  Blood Culture (routine x 2)     Status: None (Preliminary result)   Collection Time: 08/03/22 11:14 PM   Specimen: BLOOD  Result Value Ref Range Status   Specimen Description BLOOD BLOOD RIGHT FOREARM  Final   Special Requests   Final    BOTTLES DRAWN AEROBIC AND ANAEROBIC Blood Culture results may not be optimal due to an excessive volume of blood received in culture bottles   Culture   Final    NO GROWTH < 12 HOURS Performed at [facility], [address] [place], [place] [address]    Report Status PENDING  Incomplete  Urine Culture     Status: None   Collection Time: 08/03/22 11:14 PM   Specimen: In/Out Cath  Urine  Result Value Ref Range Status   Specimen Description   Final    IN/OUT CATH URINE Performed at [facility], [address] [place], [place] [address]    Special Requests   Final    NONE Performed at [facility], [address], [place], [place] [address]    Culture   Final    NO GROWTH Performed at [facility], [address] [place], [place] [address]    Report Status 08/05/2022 FINAL  Final  Aerobic/Anaerobic Culture w Gram Stain (surgical/deep wound)     Status: None (Preliminary result)   Collection Time: 08/04/22 10:50 AM   Specimen: Leg; Wound  Result Value Ref Range Status   Specimen Description   Final    LEG Performed at [facility], [address] [place], [place] [address]    Special Requests   Final    LEFT LEG Performed at [facility], [address] [place], [place] [address]    Gram Stain   Final    FEW GRAM POSITIVE COCCI IN PAIRS NO WBC SEEN Performed at [facility], [address], [place], [place] [address]  Culture   Final    FEW METHICILLIN RESISTANT STAPHYLOCOCCUS AUREUS NO ANAEROBES ISOLATED; CULTURE IN PROGRESS FOR 5 DAYS    Report Status PENDING  Incomplete   Organism ID, Bacteria METHICILLIN RESISTANT STAPHYLOCOCCUS AUREUS  Final      Susceptibility   Methicillin resistant staphylococcus aureus - MIC*    CIPROFLOXACIN >=8 RESISTANT Resistant     ERYTHROMYCIN >=8 RESISTANT Resistant     GENTAMICIN <=0.5 SENSITIVE Sensitive     OXACILLIN >=4 RESISTANT Resistant     TETRACYCLINE <=1 SENSITIVE Sensitive     VANCOMYCIN 1 SENSITIVE Sensitive     TRIMETH/SULFA >=320 RESISTANT Resistant     CLINDAMYCIN >=8 RESISTANT Resistant     RIFAMPIN <=0.5 SENSITIVE Sensitive     Inducible Clindamycin NEGATIVE Sensitive     * FEW METHICILLIN RESISTANT STAPHYLOCOCCUS AUREUS  Resp panel by RT-PCR (RSV, Flu A&B, Covid) Anterior Nasal Swab     Status: None   Collection Time: 08/06/22 10:30 AM    Specimen: Anterior Nasal Swab  Result Value Ref Range Status   SARS Coronavirus 2 by RT PCR NEGATIVE NEGATIVE Final    Comment: (NOTE) SARS-CoV-2 target nucleic acids are NOT DETECTED.  The SARS-CoV-2 RNA is generally detectable in upper respiratory specimens during the acute phase of infection. The lowest concentration of SARS-CoV-2 viral copies this assay can detect is 138 copies/mL. A negative result does not preclude SARS-Cov-2 infection and should not be used as the sole basis for treatment or other patient management decisions. A negative result may occur with  improper specimen collection/handling, submission of specimen other than nasopharyngeal swab, presence of viral mutation(s) within the areas targeted by this assay, and inadequate number of viral copies(<138 copies/mL). A negative result must be combined with clinical observations, patient history, and epidemiological information. The expected result is Negative.  Fact Sheet for Patients:  BloggerCourse.com  Fact Sheet for Healthcare Providers:  SeriousBroker.it  This test is no t yet approved or cleared by the [place] FDA and  has been authorized for detection and/or diagnosis of SARS-CoV-2 by FDA under an Emergency Use Authorization (EUA). This EUA will remain  in effect (meaning this test can be used) for the duration of the COVID-19 declaration under Section 564(b)(1) of the Act, 21 U.S.C.section 360bbb-3(b)(1), unless the authorization is terminated  or revoked sooner.       Influenza A by PCR NEGATIVE NEGATIVE Final   Influenza B by PCR NEGATIVE NEGATIVE Final    Comment: (NOTE) The Xpert Xpress SARS-CoV-2/FLU/RSV plus assay is intended as an aid in the diagnosis of influenza from Nasopharyngeal swab specimens and should not be used as a sole basis for treatment. Nasal washings and aspirates are unacceptable for Xpert Xpress  SARS-CoV-2/FLU/RSV testing.  Fact Sheet for Patients: BloggerCourse.com  Fact Sheet for Healthcare Providers: SeriousBroker.it  This test is not yet approved or cleared by the [place] FDA and has been authorized for detection and/or diagnosis of SARS-CoV-2 by FDA under an Emergency Use Authorization (EUA). This EUA will remain in effect (meaning this test can be used) for the duration of the COVID-19 declaration under Section 564(b)(1) of the Act, 21 U.S.C. section 360bbb-3(b)(1), unless the authorization is terminated or revoked.     Resp Syncytial Virus by PCR NEGATIVE NEGATIVE Final    Comment: (NOTE) Fact Sheet for Patients: BloggerCourse.com  Fact Sheet for Healthcare Providers: SeriousBroker.it  This test is not yet approved or cleared by the [place] FDA and has been authorized for detection  and/or diagnosis of SARS-CoV-2 by FDA under an Emergency Use Authorization (EUA). This EUA will remain in effect (meaning this test can be used) for the duration of the COVID-19 declaration under Section 564(b)(1) of the Act, 21 U.S.C. section 360bbb-3(b)(1), unless the authorization is terminated or revoked.  Performed at [facility], [address] [place], [place] [id number]     Labs: CBC: Recent Labs  Lab 08/03/22 2237 08/04/22 0345 08/05/22 0525 08/06/22 0528  WBC 33.6* 21.8* 14.9* 14.6*  HGB 13.4 12.2* 13.4 10.4*  HCT 39.9 34.4* 40.6 30.7*  MCV 89.3 84.3 89.4 88.5  PLT 382 275 209 257   Basic Metabolic Panel: Recent Labs  Lab 08/03/22 2237 08/04/22 0345 08/05/22 0525 08/06/22 0528  NA 134* 132* 133* 135  K 3.9 3.8 4.5 3.4*  CL 94* 97* 99 102  CO2 10* 25 25 25   GLUCOSE 61* 114* 93 97  BUN 16 15 15 11   CREATININE 1.89* 1.23 1.05 0.84  CALCIUM 9.7 8.7* 7.9* 8.1*  MG  --  2.1 2.0 1.8  PHOS  --  3.6 2.9 3.2   Liver Function  Tests: Recent Labs  Lab 08/03/22 2237 08/04/22 0345 08/05/22 0525 08/06/22 0528  AST 71* 62* 94* 113*  ALT 47* 39 41 66*  ALKPHOS 77 57 74 137*  BILITOT 1.9* 2.0* 1.0 0.7  PROT 8.5* 6.9 5.3* 5.8*  ALBUMIN 4.6 4.1 2.8* 2.9*   CBG: Recent Labs  Lab 08/04/22 2338 08/05/22 0344 08/05/22 0831 08/05/22 1116 08/05/22 2019  GLUCAP 98 113* 95 95 125*    Discharge time spent: greater than 30 minutes.  Signed: Delfino LovettVipul Glennie Bose, MD [facility] 08/06/2022

## 2022-08-06 NOTE — Progress Notes (Signed)
Physical Therapy Treatment Patient Details Name: Brian Decker. MRN: [id number] DOB: [birth year] Today's Date: 08/06/2022   History of Present Illness 27 y/o male presented to ED on 08/03/22 for "acting out" and combative. Intubated 1/15-1/16. Admitted for acute toxic metabolic encephalopathy 2/2 drug overdose and L LE cellulitis. PMH: IV drug abuse, polysubstance abuse    PT Comments    Patient supine in bed on arrival and agreeable to therapy session. Patient complaining of L LE pain throughout session. Completed bed mobility and sit to stand modI with no AD. Ambulated 2 laps in hallway with no AD and supervision for safety. Demonstrating decreased stance time on L due to pain but improves with increasing time up. Encouraged continued mobility to improve pain tolerance and strength. D/c plan remains appropriate.     Recommendations for follow up therapy are one component of a multi-disciplinary discharge planning process, led by the attending physician.  Recommendations may be updated based on patient status, additional functional criteria and insurance authorization.  Follow Up Recommendations  No PT follow up     Assistance Recommended at Discharge PRN  Patient can return home with the following     Equipment Recommendations  None recommended by PT    Recommendations for Other Services       Precautions / Restrictions Precautions Precautions: Fall Restrictions Weight Bearing Restrictions: No     Mobility  Bed Mobility Overal bed mobility: Modified Independent                  Transfers Overall transfer level: Modified independent Equipment used: None                    Ambulation/Gait Ambulation/Gait assistance: Supervision Gait Distance (Feet): 320 Feet Assistive device: None Gait Pattern/deviations: Step-through pattern, Decreased stance time - left, Decreased stride length, Decreased weight shift to left Gait velocity: WFL Gait velocity  interpretation: >4.37 ft/sec, indicative of normal walking speed   General Gait Details: supervision for safety. Improved gait quality with increasing distance. Decreased stance time on L due to pain   Stairs             Wheelchair Mobility    Modified Rankin (Stroke Patients Only)       Balance Overall balance assessment: Needs assistance Sitting-balance support: Feet supported, No upper extremity supported Sitting balance-Leahy Scale: Normal     Standing balance support: No upper extremity supported Standing balance-Leahy Scale: Fair                              Cognition Arousal/Alertness: Awake/alert Behavior During Therapy: Impulsive, Restless Overall Cognitive Status: Impaired/Different from baseline Area of Impairment: Attention, Following commands, Safety/judgement                   Current Attention Level: Sustained Memory: Decreased recall of precautions Following Commands: Follows one step commands consistently Safety/Judgement: Decreased awareness of safety, Decreased awareness of deficits              Exercises      General Comments        Pertinent Vitals/Pain Pain Assessment Pain Assessment: Faces Faces Pain Scale: Hurts even more Pain Location: L leg Pain Descriptors / Indicators: Grimacing, Discomfort Pain Intervention(s): Monitored during session, Repositioned    Home Living  Prior Function            PT Goals (current goals can now be found in the care plan section) Acute Rehab PT Goals PT Goal Formulation: Patient unable to participate in goal setting Time For Goal Achievement: 08/19/22 Potential to Achieve Goals: Good Progress towards PT goals: Progressing toward goals    Frequency    Min 2X/week      PT Plan Current plan remains appropriate    Co-evaluation              AM-PAC PT "6 Clicks" Mobility   Outcome Measure  Help needed turning from your  back to your side while in a flat bed without using bedrails?: None Help needed moving from lying on your back to sitting on the side of a flat bed without using bedrails?: None Help needed moving to and from a bed to a chair (including a wheelchair)?: None Help needed standing up from a chair using your arms (e.g., wheelchair or bedside chair)?: None Help needed to walk in hospital room?: A Little Help needed climbing 3-5 steps with a railing? : A Little 6 Click Score: 22    End of Session   Activity Tolerance: Patient tolerated treatment well Patient left: in bed;with call bell/phone within reach;with nursing/sitter in room Nurse Communication: Mobility status PT Visit Diagnosis: Unsteadiness on feet (R26.81);Muscle weakness (generalized) (M62.81);Difficulty in walking, not elsewhere classified (R26.2)     Time: 2423-5361 PT Time Calculation (min) (ACUTE ONLY): 9 min  Charges:  $Gait Training: 8-22 mins                     Jammy Plotkin A. Gilford Rile PT, DPT [facility] - Acute Rehabilitation Services    Bladyn Tipps A Sarissa Dern 08/06/2022, 3:14 PM

## 2022-08-06 NOTE — Progress Notes (Signed)
Occupational Therapy Treatment Patient Details Name: Brian Decker. MRN: [id number] DOB: October 26, 1995 Today's Date: 08/06/2022   History of present illness 27 y/o male presented to ED on 08/03/22 for "acting out" and combative. Intubated 1/15-1/16. Admitted for acute toxic metabolic encephalopathy 2/2 drug overdose and L LE cellulitis. PMH: IV drug abuse, polysubstance abuse   OT comments  Mr Peak was seen for OT treatment on this date. Upon arrival to room pt reclined in bed, agreeable to tx. Pt requires CGA + RW toilet t/f, walker frequently off balance on 2 legs however no true LOBs noted as pt balances well on 1 leg. Encouraged to put weight through LLE in standing and to complete therex. MOD I don underwear bed level with bridging. Pt making good progress toward goals, will continue to follow POC. Discharge recommendation remains appropriate.    Recommendations for follow up therapy are one component of a multi-disciplinary discharge planning process, led by the attending physician.  Recommendations may be updated based on patient status, additional functional criteria and insurance authorization.    Follow Up Recommendations  No OT follow up     Assistance Recommended at Discharge Frequent or constant Supervision/Assistance  Patient can return home with the following  A little help with walking and/or transfers;A little help with bathing/dressing/bathroom;Help with stairs or ramp for entrance   Equipment Recommendations  None recommended by OT    Recommendations for Other Services      Precautions / Restrictions Precautions Precautions: Fall Restrictions Weight Bearing Restrictions: No       Mobility Bed Mobility Overal bed mobility: Modified Independent                  Transfers Overall transfer level: Needs assistance Equipment used: Rolling walker (2 wheels) Transfers: Sit to/from Stand Sit to Stand: Min guard           General transfer comment:  impulsive     Balance Overall balance assessment: Needs assistance Sitting-balance support: Feet supported, No upper extremity supported Sitting balance-Leahy Scale: Normal     Standing balance support: Bilateral upper extremity supported, During functional activity Standing balance-Leahy Scale: Fair                             ADL either performed or assessed with clinical judgement   ADL Overall ADL's : Needs assistance/impaired                                       General ADL Comments: CGA + RW toilet t/f, multiple LOBs. MOD I don underwear bed level with bridging      Cognition Arousal/Alertness: Awake/alert Behavior During Therapy: Impulsive, Restless Overall Cognitive Status: Impaired/Different from baseline Area of Impairment: Attention, Following commands, Safety/judgement                   Current Attention Level: Sustained Memory: Decreased recall of precautions Following Commands: Follows one step commands consistently Safety/Judgement: Decreased awareness of safety, Decreased awareness of deficits     General Comments: cues to redirect                   Pertinent Vitals/ Pain       Pain Assessment Pain Assessment: 0-10 Pain Score: 7  Pain Location: L leg Pain Descriptors / Indicators: Grimacing, Discomfort Pain Intervention(s): Limited activity within patient's tolerance, Repositioned  Frequency  Min 2X/week        Progress Toward Goals  OT Goals(current goals can now be found in the care plan section)  Progress towards OT goals: Progressing toward goals  Acute Rehab OT Goals Patient Stated Goal: to improve pain OT Goal Formulation: With patient Time For Goal Achievement: 08/19/22 Potential to Achieve Goals: Good ADL Goals Pt Will Perform Grooming: Independently;standing Pt Will Perform Lower Body Dressing: Independently;sit to/from stand Pt Will Transfer to Toilet:  Independently;ambulating;regular height toilet Pt/caregiver will Perform Home Exercise Program: Increased ROM;Increased strength;Independently  Plan Discharge plan remains appropriate;Frequency remains appropriate    Co-evaluation                 AM-PAC OT "6 Clicks" Daily Activity     Outcome Measure   Help from another person eating meals?: None Help from another person taking care of personal grooming?: A Little Help from another person toileting, which includes using toliet, bedpan, or urinal?: None Help from another person bathing (including washing, rinsing, drying)?: A Little Help from another person to put on and taking off regular upper body clothing?: None Help from another person to put on and taking off regular lower body clothing?: None 6 Click Score: 22    End of Session    OT Visit Diagnosis: Other abnormalities of gait and mobility (R26.89);Muscle weakness (generalized) (M62.81)   Activity Tolerance Patient tolerated treatment well   Patient Left in bed;with call bell/phone within reach;with nursing/sitter in room   Nurse Communication Mobility status        Time: 1610-9604 OT Time Calculation (min): 11 min  Charges: OT General Charges $OT Visit: 1 Visit OT Treatments $Self Care/Home Management : 8-22 mins  Brian Decker, M.S. OTR/L  08/06/22, 2:45 PM  ascom [phone]

## 2022-08-07 DIAGNOSIS — F1510 Other stimulant abuse, uncomplicated: Secondary | ICD-10-CM | POA: Insufficient documentation

## 2022-08-07 DIAGNOSIS — F4325 Adjustment disorder with mixed disturbance of emotions and conduct: Principal | ICD-10-CM

## 2022-08-07 NOTE — Progress Notes (Signed)
Patient refused to come out to take his scheduled, evening medication.

## 2022-08-07 NOTE — BH Assessment (Signed)
CSW attempted to complete PSA with patient.  Patient stated "I don't want to talk to nobody."    CSW left without incident.  Attempt will be made at a later time.  Assunta Curtis, MSW, LCSW 08/07/2022 9:05 AM

## 2022-08-07 NOTE — Plan of Care (Signed)
D- Patient alert and oriented. Patient presented in an anxious, preoccupied mood on assessment reporting that he slept poor last night, stating that he just couldn't sleep and wanted to get back to bed. Patient denied anxiety, but endorsed hopelessness and depression on his self-inventory, but did not go into detail due to wanting to rest. Patient also denied SI, HI, AVH at this time. Patient's goal for today is "self love".  A- Some scheduled medications administered to patient, per MD orders. Support and encouragement provided.  Routine safety checks conducted every 15 minutes.  Patient informed to notify staff with problems or concerns.  R- No adverse drug reactions noted. Patient contracts for safety at this time. Patient compliant with medications and treatment plan. Patient receptive, calm, and cooperative. Patient isolates to room, except for meals. Patient remains safe at this time.  Problem: Education: Goal: Knowledge of General Education information will improve Description: Including pain rating scale, medication(s)/side effects and non-pharmacologic comfort measures Outcome: Not Progressing   Problem: Health Behavior/Discharge Planning: Goal: Ability to manage health-related needs will improve Outcome: Not Progressing   Problem: Clinical Measurements: Goal: Ability to maintain clinical measurements within normal limits will improve Outcome: Not Progressing Goal: Will remain free from infection Outcome: Not Progressing Goal: Diagnostic test results will improve Outcome: Not Progressing Goal: Respiratory complications will improve Outcome: Not Progressing Goal: Cardiovascular complication will be avoided Outcome: Not Progressing   Problem: Activity: Goal: Risk for activity intolerance will decrease Outcome: Not Progressing   Problem: Nutrition: Goal: Adequate nutrition will be maintained Outcome: Not Progressing   Problem: Coping: Goal: Level of anxiety will  decrease Outcome: Not Progressing   Problem: Elimination: Goal: Will not experience complications related to bowel motility Outcome: Not Progressing Goal: Will not experience complications related to urinary retention Outcome: Not Progressing   Problem: Pain Managment: Goal: General experience of comfort will improve Outcome: Not Progressing   Problem: Safety: Goal: Ability to remain free from injury will improve Outcome: Not Progressing   Problem: Skin Integrity: Goal: Risk for impaired skin integrity will decrease Outcome: Not Progressing

## 2022-08-07 NOTE — H&P (Signed)
Psychiatric Admission Assessment Adult  Patient Identification: Brian Decker. 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  Patient refused to cooperate with examination.  Spoke with his mother by telephone to gain more recent history.  27 year old man with a well-established long history of substance abuse was brought to the hospital on January 14 intoxicated and extremely agitated.  He required sedation for safety in the emergency room to the point of needing intubation.  Stabilized in the ICU and transferred to the medical service he is under IVC and now transferred to psychiatric service.  Mother has reported that patient had made suicidal statements recently and implied that he was in despair about his situation.  No evidence of suicidal attempts although ongoing heavy substance abuse.  Patient refusing to engage in interview today.  Documentation from upstairs suggests he has still been sad and emotional although without any attempts at self-harm.  Mother reports that the patient had been in an [place] house and had apparently been doing well with some sobriety from September until the beginning of this month when he relapsed.  Drug screen on presentation positive for amphetamine cocaine and benzodiazepines and cannabis.  Patient himself did tell me today that he had been using methamphetamine.  Would not go into any more detail.  Denied any other drugs. Associated Signs/Symptoms: Depression Symptoms:  psychomotor agitation, difficulty concentrating, hopelessness, anxiety, (Hypo) Manic  Symptoms:  Impulsivity, Irritable Mood, Anxiety Symptoms:   Irritable but not engaging in enough interview to be sure Psychotic Symptoms:   On presentation has been documented to have psychotic symptoms when intoxicated on stimulants.  Currently does not appear to be showing psychosis PTSD Symptoms: Unable to assess Total Time spent with patient: 45 minutes  Past Psychiatric History: Patient has a long well-established history of substance abuse problems going back to about age 11.  Long history of opiate abuse.  Mother reports that more recently has primarily been stimulant abuse.  Has been in some detox programs before.  Has long resisted engaging in more intensive rehabilitation.  Recently was in an [place] house in [place] holding a job and evidently doing well until relapsing at the beginning of this month.  No documented prior psychiatric admissions that I was able to find although he has been occasionally seen for substance intoxication.  No known suicide attempts although has had overdoses and misuse of drugs multiple times  Is the patient at risk to self? Yes.    Has the patient been a risk to self in the past 6 months? Yes.    Has the patient been a risk to self within the distant past? Yes.    Is the patient a risk to others? No.  Has the patient been a risk to others in the past 6 months? No.  Has the patient been a risk to others within the distant past? No.   [place] Scale:  Flowsheet Row Admission (Current) from 08/06/2022 in [facility] INPATIENT BEHAV[facility]AL MEDICINE ED from 07/27/2022 in [facility] ED to Hosp-Admission (Discharged) from 01/04/2021 in [facility] 1C MEDICAL TELEMETRY  C-SSRS RISK CATEGORY No Risk  No Risk No Risk        Prior Inpatient Therapy: Yes.   If yes, describe the only "inpatient" treatment we are aware of may be brief detox Prior Outpatient Therapy: No. If yes, describe nothing known about  following up with outpatient mental health treatment  Alcohol Screening: Patient refused Alcohol Screening Tool: Yes 1. How often do you have a drink containing alcohol?: Never 2. How many drinks containing alcohol do you have on a typical day when you are drinking?: 1 or 2 3. How often do you have six or more drinks on one occasion?: Never AUDIT-C Score: 0 4. How often during the last year have you found that you were not able to stop drinking once you had started?: Never 5. How often during the last year have you failed to do what was normally expected from you because of drinking?: Never 6. How often during the last year have you needed a first drink in the morning to get yourself going after a heavy drinking session?: Never 7. How often during the last year have you had a feeling of guilt of remorse after drinking?: Never 8. How often during the last year have you been unable to remember what happened the night before because you had been drinking?: Never 9. Have you or someone else been injured as a result of your drinking?: No 10. Has a relative or friend or a doctor or another health worker been concerned about your drinking or suggested you cut down?: No Alcohol Use Disorder Identification Test Final Score (AUDIT): 0 Substance Abuse History in the last 12 months:  Yes.   Consequences of Substance Abuse: Multiple severe consequences including life-threatening physical consequences, homelessness, and estrangement from loved ones and support, mood instability with reported suicidal thoughts. Previous Psychotropic Medications: No  Psychological Evaluations: Yes  Past Medical History:  Past Medical History:  Diagnosis Date   Asthma    Cocaine abuse (HCC)    Crack cocaine use    Hepatitis    Heroin abuse (HCC) 04/07/2020   Methamphetamine use (HCC) 04/07/2020   History reviewed. No pertinent surgical history. Family History: History reviewed. No pertinent family history. Family  Psychiatric  History: Reportedly has an older brother who also has substance problems Tobacco Screening:  Social History   Tobacco Use  Smoking Status Every Day   Packs/day: 0.50   Types: Cigarettes  Smokeless Tobacco Never    BH Tobacco Counseling     Are you interested in Tobacco Cessation Medications?  Yes, implement Nicotene Replacement Protocol Counseled patient on smoking cessation:  Refused/Declined practical counseling Reason Tobacco Screening Not Completed: Patient Refused Screening       Social History:  Social History   Substance and Sexual Activity  Alcohol Use Yes   Comment: occ     Social History   Substance and Sexual Activity  Drug Use Yes   Types: Marijuana, IV, Cocaine, Methamphetamines   Comment: heroin    Additional Social History:                           Allergies:   Allergies  Allergen Reactions   Bee Venom Anaphylaxis   Penicillins Anaphylaxis    Per Mom, tolerates cefepime 08/04/2022   Lab Results:  Results for orders placed or performed during the hospital encounter of 08/03/22 (from the past 48 hour(s))  Glucose, capillary     Status: None   Collection Time: 08/05/22 11:16 AM  Result Value Ref Range   Glucose-Capillary 95 70 - 99 mg/dL    Comment: Glucose reference range applies only to samples taken after fasting for at least 8 hours.  Glucose, capillary     Status: Abnormal   Collection Time: 08/05/22  8:19 PM  Result Value Ref Range   Glucose-Capillary 125 (H) 70 - 99 mg/dL    Comment: Glucose reference range applies only to samples taken after fasting for at least 8 hours.  Comprehensive metabolic panel     Status: Abnormal   Collection Time: 08/06/22  5:28 AM  Result Value Ref Range   Sodium 135 135 - 145 mmol/L   Potassium 3.4 (L) 3.5 - 5.1 mmol/L   Chloride 102 98 - 111 mmol/L   CO2 25 22 - 32 mmol/L   Glucose, Bld 97 70 - 99 mg/dL    Comment: Glucose reference range applies only to samples taken after fasting  for at least 8 hours.   BUN 11 6 - 20 mg/dL   Creatinine, Ser 0.05 0.61 - 1.24 mg/dL   Calcium 8.1 (L) 8.9 - 10.3 mg/dL   Total Protein 5.8 (L) 6.5 - 8.1 g/dL   Albumin 2.9 (L) 3.5 - 5.0 g/dL   AST 110 (H) 15 - 41 U/L   ALT 66 (H) 0 - 44 U/L   Alkaline Phosphatase 137 (H) 38 - 126 U/L   Total Bilirubin 0.7 0.3 - 1.2 mg/dL   GFR, Estimated >21 >11 mL/min    Comment: (NOTE) Calculated using the CKD-EPI Creatinine Equation (2021)    Anion gap 8 5 - 15    Comment: Performed at [facility], [address] [place], [place] [address]  CBC     Status: Abnormal   Collection Time: 08/06/22  5:28 AM  Result Value Ref Range   WBC 14.6 (H) 4.0 - 10.5 K/uL   RBC 3.47 (L) 4.22 - 5.81 MIL/uL   Hemoglobin 10.4 (L) 13.0 - 17.0 g/dL   HCT 01.4 (L) 10.3 - 01.3 %   MCV 88.5 80.0 - 100.0 fL   MCH 30.0 26.0 - 34.0 pg   MCHC 33.9 30.0 - 36.0 g/dL   RDW 14.3 88.8 - 75.7 %   Platelets 257 150 - 400 K/uL   nRBC 0.0 0.0 - 0.2 %    Comment: Performed at [facility], [address] [place], [place] [address]  Magnesium     Status: None   Collection Time: 08/06/22  5:28 AM  Result Value Ref Range   Magnesium 1.8 1.7 - 2.4 mg/dL    Comment: Performed at [facility], [address] [place], [place] [address]  Phosphorus     Status: None   Collection Time: 08/06/22  5:28 AM  Result Value Ref Range   Phosphorus 3.2 2.5 - 4.6 mg/dL    Comment: Performed at [facility], [address] [place], [place] [address]  Hepatitis panel, acute     Status: Abnormal   Collection Time: 08/06/22  5:28 AM  Result Value Ref Range   Hepatitis B Surface Ag NON REACTIVE NON REACTIVE   HCV Ab Reactive (A) NON REACTIVE    Comment: (NOTE) The CDC recommends that a Reactive HCV antibody result be followed up  with a HCV Nucleic Acid Amplification test.     Hep A IgM NON REACTIVE NON REACTIVE   Hep B C IgM NON REACTIVE NON REACTIVE    Comment: Performed at [facility], [address] [place], [place] [address]  Resp panel by RT-PCR (RSV, Flu A&B, Covid) Anterior Nasal Swab     Status: None   Collection Time: 08/06/22 10:30 AM   Specimen: Anterior Nasal Swab  Result Value Ref Range   SARS Coronavirus 2 by RT PCR NEGATIVE NEGATIVE    Comment: (NOTE) SARS-CoV-2 target nucleic acids are NOT DETECTED.  The SARS-CoV-2 RNA is generally detectable in upper respiratory specimens during the acute phase of infection. The lowest concentration of SARS-CoV-2 viral copies this assay can detect is 138 copies/mL. A negative result does not preclude SARS-Cov-2 infection and should not be used as the sole basis for treatment or other patient management decisions. A negative result may occur with  improper specimen collection/handling, submission of specimen other than nasopharyngeal swab, presence of viral mutation(s) within the areas targeted by this assay, and inadequate number of viral copies(<138 copies/mL). A negative result must be combined with clinical observations, patient history, and epidemiological information. The expected result is Negative.  Fact Sheet for Patients:  EntrepreneurPulse.com.au  Fact Sheet for Healthcare Providers:  IncredibleEmployment.be  This test is no t yet approved or cleared by the [place] FDA and  has been authorized for detection and/or diagnosis of SARS-CoV-2 by FDA under an Emergency Use Authorization (EUA). This EUA will remain  in effect (meaning this test can be used) for the duration of the COVID-19 declaration under Section 564(b)(1) of the Act, 21 U.S.C.section 360bbb-3(b)(1), unless the authorization is terminated  or revoked sooner.       Influenza A by PCR NEGATIVE NEGATIVE   Influenza B by PCR NEGATIVE NEGATIVE    Comment: (NOTE) The Xpert Xpress SARS-CoV-2/FLU/RSV plus assay is intended as an aid in the diagnosis of influenza from Nasopharyngeal swab specimens  and should not be used as a sole basis for treatment. Nasal washings and aspirates are unacceptable for Xpert Xpress SARS-CoV-2/FLU/RSV testing.  Fact Sheet for Patients: EntrepreneurPulse.com.au  Fact Sheet for Healthcare Providers: IncredibleEmployment.be  This test is not yet approved or cleared by the [place] FDA and has been authorized for detection and/or diagnosis of SARS-CoV-2 by FDA under an Emergency Use Authorization (EUA). This EUA will remain in effect (meaning this test can be used) for the duration of the COVID-19 declaration under Section 564(b)(1) of the Act, 21 U.S.C. section 360bbb-3(b)(1), unless the authorization is terminated or revoked.     Resp Syncytial Virus by PCR NEGATIVE NEGATIVE    Comment: (NOTE) Fact Sheet for Patients: EntrepreneurPulse.com.au  Fact Sheet for Healthcare Providers: IncredibleEmployment.be  This test is not yet approved or cleared by the [place] FDA and has been authorized for detection and/or diagnosis of SARS-CoV-2 by FDA under an Emergency Use Authorization (EUA). This EUA will remain in effect (meaning this test can be used) for the duration of the COVID-19 declaration under Section 564(b)(1) of the Act, 21 U.S.C. section 360bbb-3(b)(1), unless the authorization is terminated or revoked.  Performed at [facility], [address] [place], [place] [address]     Blood Alcohol level:  Lab Results  Component Value Date   [facility] <10 08/03/2022   ETH <10 74/25/9563    Metabolic Disorder Labs:  No results found for: "HGBA1C", "MPG" No results found for: "PROLACTIN" No results found for: "CHOL", "TRIG", "HDL", "CHOLHDL", "VLDL", "LDLCALC"  Current Medications: Current Facility-Administered Medications  Medication Dose Route Frequency Provider Last Rate Last Admin   acetaminophen (TYLENOL) tablet 650 mg  650 mg Oral Q6H  PRN  Lacie Landry, Madie Reno, MD   650 mg at 08/07/22 0651   alum & mag hydroxide-simeth (MAALOX/MYLANTA) 200-200-20 MG/5ML suspension 30 mL  30 mL Oral Q4H PRN Elyn Krogh, Madie Reno, MD       ascorbic acid (VITAMIN C) tablet 500 mg  500 mg Oral BID Harbour Nordmeyer T, MD   500 mg at 08/07/22 0900   doxycycline (VIBRA-TABS) tablet 100 mg  100 mg Oral Q12H Rykar Lebleu T, MD   100 mg at 08/07/22 0651   famotidine (PEPCID) tablet 20 mg  20 mg Oral BID Dametrius Sanjuan T, MD   20 mg at 08/07/22 0900   hydrOXYzine (ATARAX) tablet 50 mg  50 mg Oral TID PRN Sundeep Destin, Madie Reno, MD   50 mg at 08/06/22 2139   magnesium hydroxide (MILK OF MAGNESIA) suspension 30 mL  30 mL Oral Daily PRN Pericles Carmicheal, Madie Reno, MD       traZODone (DESYREL) tablet 100 mg  100 mg Oral QHS PRN Avayah Raffety, Madie Reno, MD   100 mg at 08/06/22 2139   PTA Medications: Medications Prior to Admission  Medication Sig Dispense Refill Last Dose   doxycycline (VIBRA-TABS) 100 MG tablet Take 1 tablet (100 mg total) by mouth every 12 (twelve) hours for 5 days. 10 tablet 0    feeding supplement (ENSURE ENLIVE / ENSURE PLUS) LIQD Take 237 mLs by mouth 3 (three) times daily between meals. 614 mL 12    folic acid (FOLVITE) 1 MG tablet Take 1 tablet (1 mg total) by mouth daily.      Multiple Vitamin (MULTIVITAMIN WITH MINERALS) TABS tablet Take 1 tablet by mouth daily.      thiamine (VITAMIN B-1) 100 MG tablet Take 1 tablet (100 mg total) by mouth daily.       Musculoskeletal: Strength & Muscle Tone: within normal limits Gait & Station: normal Patient leans: N/A            Psychiatric Specialty Exam:  Presentation  General Appearance:  Disheveled  Eye Contact: Poor  Speech: Clear and Coherent  Speech Volume: Normal  Handedness: Right   Mood and Affect  Mood: Depressed; Hopeless; Worthless  Affect: Depressed; Flat; Tearful   Thought Process  Thought Processes: Coherent  Duration of Psychotic Symptoms: A little unclear but appears that he  may have had mood problems since relapsing which appears to have been the beginning of January Past Diagnosis of Schizophrenia or Psychoactive disorder: No data recorded Descriptions of Associations:Intact  Orientation:Full (Time, Place and Person)  Thought Content:Illogical  Hallucinations:No data recorded Ideas of Reference:None  Suicidal Thoughts:No data recorded Homicidal Thoughts:No data recorded  Sensorium  Memory: Immediate Good  Judgment: Poor  Insight: Poor   Executive Functions  Concentration: Fair  Attention Span: Fair  Recall: [place] of Knowledge: Fair  Language: Good   Psychomotor Activity  Psychomotor Activity:No data recorded  Assets  Assets: Social Support; Resilience   Sleep  Sleep:No data recorded   Physical Exam: Physical Exam Vitals and nursing note reviewed.  Constitutional:      Appearance: Normal appearance.  HENT:     Head: Normocephalic and atraumatic.     Mouth/Throat:     Pharynx: Oropharynx is clear.  Eyes:     Pupils: Pupils are equal, round, and reactive to light.  Cardiovascular:     Rate and Rhythm: Normal rate and regular rhythm.  Pulmonary:     Effort: Pulmonary effort is normal.     Breath sounds: Normal breath  sounds.  Abdominal:     General: Abdomen is flat.     Palpations: Abdomen is soft.  Musculoskeletal:        General: Normal range of motion.  Skin:    General: Skin is warm and dry.  Neurological:     General: No focal deficit present.     Mental Status: He is alert. Mental status is at baseline.  Psychiatric:        Attention and Perception: He is inattentive.        Mood and Affect: Affect is blunt and inappropriate.        Speech: He is noncommunicative.    Review of Systems  Unable to perform ROS: Psychiatric disorder   Blood pressure 129/86, pulse (!) 105, temperature 99 F (37.2 C), temperature source Oral, resp. rate 17, height 5\' 10"  (1.778 m), weight 68.9 kg, SpO2 97 %.  Body mass index is 21.81 kg/m.  Treatment Plan Summary: Plan patient seems to be medically stable now.  He will be allowed to rest and reevaluated with full treatment team evaluation and meeting with treatment team tomorrow.  We will discuss options for substance use treatment including the possibility of inpatient rehab.  So far the patient has stated he plans to turn himself in as he knows that he has an outstanding warrant.  Ongoing assessment of dangerousness and the possibility of need for any other mental health treatment.  Observation Level/Precautions:  15 minute checks  Laboratory:  UDS  Psychotherapy:    Medications:    Consultations:    Discharge Concerns:    Estimated LOS:  Other:     Physician Treatment Plan for Primary Diagnosis: Adjustment disorder with mixed disturbance of emotions and conduct Long Term Goal(s): Improvement in symptoms so as ready for discharge  Short Term Goals: Ability to verbalize feelings will improve, Ability to demonstrate self-control will improve, and Ability to identify and develop effective coping behaviors will improve  Physician Treatment Plan for Secondary Diagnosis: Principal Problem:   Adjustment disorder with mixed disturbance of emotions and conduct Active Problems:   Cocaine use   Amphetamine abuse (HCC)  Long Term Goal(s): Improvement in symptoms so as ready for discharge  Short Term Goals: Ability to identify and develop effective coping behaviors will improve and Ability to maintain clinical measurements within normal limits will improve  I certify that inpatient services furnished can reasonably be expected to improve the patient's condition.    Mordecai RasmussenJohn Kashena Novitski, MD 1/18/202410:52 AM

## 2022-08-07 NOTE — Group Note (Signed)
[facility] LCSW Group Therapy Note   Group Date: 08/07/2022 Start Time: 1300 End Time: 1400   Type of Therapy/Topic:  Group Therapy:  Balance in Life  Participation Level:  Did Not Attend   Description of Group:    This group will address the concept of balance and how it feels and looks when one is unbalanced. Patients will be encouraged to process areas in their lives that are out of balance, and identify reasons for remaining unbalanced. Facilitators will guide patients utilizing problem- solving interventions to address and correct the stressor making their life unbalanced. Understanding and applying boundaries will be explored and addressed for obtaining  and maintaining a balanced life. Patients will be encouraged to explore ways to assertively make their unbalanced needs known to significant others in their lives, using other group members and facilitator for support and feedback.  Therapeutic Goals: Patient will identify two or more emotions or situations they have that consume much of in their lives. Patient will identify signs/triggers that life has become out of balance:  Patient will identify two ways to set boundaries in order to achieve balance in their lives:  Patient will demonstrate ability to communicate their needs through discussion and/or role plays  Summary of Patient Progress: X   Therapeutic Modalities:   Cognitive Behavioral Therapy Solution-Focused Therapy Assertiveness Training   Jabrea Kallstrom R Eleah Lahaie, LCSW 

## 2022-08-07 NOTE — Progress Notes (Signed)
Patient has wound on his left leg that is infected.  Was getting IV antibiotics prior to coming down on the unit.  Was given an oral antibiotic and Tylenol to help with pain.  Wound was cleaned and redressed.  C Butler-Nicholson, LPN

## 2022-08-07 NOTE — BHH Suicide Risk Assessment (Signed)
[facility] Admission Suicide Risk Assessment   Nursing information obtained from:  Patient, Review of record Demographic factors:  Male, Caucasian, Unemployed, Low socioeconomic status Current Mental Status:  NA Loss Factors:  NA Historical Factors:  NA Risk Reduction Factors:  Positive social support  Total Time spent with patient: 45 minutes Principal Problem: Adjustment disorder with mixed disturbance of emotions and conduct Diagnosis:  Principal Problem:   Adjustment disorder with mixed disturbance of emotions and conduct Active Problems:   Cocaine use   Amphetamine abuse (HCC)  Subjective Data: Patient seen and chart reviewed.  27 year old man with a longstanding history of substance use problems was admitted to the intensive care unit after needing sedation in the emergency room.  Medically stabilized and transferred to the psychiatry unit.  History from mother and chart as patient is not currently cooperative.  Mother reports patient has made suicidal statements as recently as within the last week.  Does not appear documented that he had made frank suicidal statements since being in the hospital.  Patient currently refusing to answer any questions about mental health issues.  Continued Clinical Symptoms:  Alcohol Use Disorder Identification Test Final Score (AUDIT): 0 The "Alcohol Use Disorders Identification Test", Guidelines for Use in Primary Care, Second Edition.  World Pharmacologist [facility]). Score between 0-7:  no or low risk or alcohol related problems. Score between 8-15:  moderate risk of alcohol related problems. Score between 16-19:  high risk of alcohol related problems. Score 20 or above:  warrants further diagnostic evaluation for alcohol dependence and treatment.   CLINICAL FACTORS:   Alcohol/Substance Abuse/Dependencies   Musculoskeletal: Strength & Muscle Tone: within normal limits Gait & Station: normal Patient leans: N/A  Psychiatric Specialty  Exam:  Presentation  General Appearance:  Disheveled  Eye Contact: Poor  Speech: Clear and Coherent  Speech Volume: Normal  Handedness: Right   Mood and Affect  Mood: Depressed; Hopeless; Worthless  Affect: Depressed; Flat; Tearful   Thought Process  Thought Processes: Coherent  Descriptions of Associations:Intact  Orientation:Full (Time, Place and Person)  Thought Content:Illogical  History of Schizophrenia/Schizoaffective disorder:No data recorded Duration of Psychotic Symptoms:No data recorded Hallucinations:No data recorded Ideas of Reference:None  Suicidal Thoughts:No data recorded Homicidal Thoughts:No data recorded  Sensorium  Memory: Immediate Good  Judgment: Poor  Insight: Poor   Executive Functions  Concentration: Fair  Attention Span: Fair  Recall: [place] of Knowledge: Fair  Language: Good   Psychomotor Activity  Psychomotor Activity:No data recorded  Assets  Assets: Social Support; Resilience   Sleep  Sleep:No data recorded   Physical Exam: Physical Exam Vitals and nursing note reviewed.  Constitutional:      Appearance: Normal appearance.  HENT:     Head: Normocephalic and atraumatic.     Mouth/Throat:     Pharynx: Oropharynx is clear.  Eyes:     Pupils: Pupils are equal, round, and reactive to light.  Cardiovascular:     Rate and Rhythm: Normal rate and regular rhythm.  Pulmonary:     Effort: Pulmonary effort is normal.     Breath sounds: Normal breath sounds.  Abdominal:     General: Abdomen is flat.     Palpations: Abdomen is soft.  Musculoskeletal:        General: Normal range of motion.  Skin:    General: Skin is warm and dry.  Neurological:     General: No focal deficit present.     Mental Status: He is alert. Mental status is  at baseline.  Psychiatric:        Attention and Perception: He is inattentive.        Mood and Affect: Mood normal. Affect is blunt and inappropriate.         Speech: He is noncommunicative.        Behavior: Behavior is agitated. Behavior is not aggressive.    Review of Systems  Unable to perform ROS: Psychiatric disorder   Blood pressure 129/86, pulse (!) 105, temperature 99 F (37.2 C), temperature source Oral, resp. rate 17, height 5\' 10"  (1.778 m), weight 68.9 kg, SpO2 97 %. Body mass index is 21.81 kg/m.   COGNITIVE FEATURES THAT CONTRIBUTE TO RISK:  Closed-mindedness    SUICIDE RISK:   Minimal: No identifiable suicidal ideation.  Patients presenting with no risk factors but with morbid ruminations; may be classified as minimal risk based on the severity of the depressive symptoms  PLAN OF CARE: Continue 15-minute checks.  Monitor for any signs of withdrawal.  Reattempt daily engagement with treatment team to discuss symptoms.  Ongoing assessment of dangerousness prior to any discharge.  I certify that inpatient services furnished can reasonably be expected to improve the patient's condition.   Alethia Berthold, MD 08/07/2022, 10:50 AM

## 2022-08-07 NOTE — Progress Notes (Signed)
Patient has been isolative to his room since the beginning of the shift. Did come out for snack and medication. Presents anxious and restless. Denies si/hi/avh.  Tried  to jump the line during medication pass stating that he really needed his medication. Otherwise no issue on the unit.   C Butler-Nicholson, LPN

## 2022-08-07 NOTE — BHH Group Notes (Signed)
[facility] Group Notes:  (Nursing/MHT/Case Management/Adjunct)  Date:  08/07/2022  Time:  9:53 PM  Type of Therapy:   Wrap up  Participation Level:  Did Not Attend   Brian Decker 08/07/2022, 9:53 PM

## 2022-08-08 DIAGNOSIS — L03116 Cellulitis of left lower limb: Secondary | ICD-10-CM

## 2022-08-08 DIAGNOSIS — F4325 Adjustment disorder with mixed disturbance of emotions and conduct: Secondary | ICD-10-CM | POA: Diagnosis not present

## 2022-08-08 LAB — CULTURE, BLOOD (ROUTINE X 2)
Culture: NO GROWTH
Culture: NO GROWTH

## 2022-08-08 MED ORDER — GABAPENTIN 300 MG PO CAPS
600.0000 mg | ORAL_CAPSULE | Freq: Three times a day (TID) | ORAL | Status: DC | PRN
  Administered 2022-08-08 – 2022-08-10 (×4): 600 mg via ORAL
  Filled 2022-08-08 (×4): qty 2

## 2022-08-08 MED ORDER — DIPHENHYDRAMINE HCL 25 MG PO CAPS: 50.0000 mg | ORAL_CAPSULE | Freq: Four times a day (QID) | ORAL | Status: DC | PRN

## 2022-08-08 MED ORDER — NICOTINE 14 MG/24HR TD PT24: 14.0000 mg | MEDICATED_PATCH | Freq: Every day | TRANSDERMAL | Status: DC

## 2022-08-08 MED ORDER — HALOPERIDOL LACTATE 5 MG/ML IJ SOLN: 5.0000 mg | Freq: Four times a day (QID) | INTRAMUSCULAR | Status: DC | PRN

## 2022-08-08 MED ORDER — HALOPERIDOL 5 MG PO TABS: 5.0000 mg | ORAL_TABLET | Freq: Four times a day (QID) | ORAL | Status: DC | PRN

## 2022-08-08 MED ORDER — NICOTINE 21 MG/24HR TD PT24
21.0000 mg | MEDICATED_PATCH | Freq: Every day | TRANSDERMAL | Status: DC
  Administered 2022-08-08 – 2022-08-11 (×4): 21 mg via TRANSDERMAL
  Filled 2022-08-08 (×3): qty 1

## 2022-08-08 MED ORDER — QUETIAPINE FUMARATE 25 MG PO TABS
50.0000 mg | ORAL_TABLET | Freq: Every day | ORAL | Status: DC
  Administered 2022-08-08 – 2022-08-10 (×3): 50 mg via ORAL
  Filled 2022-08-08 (×3): qty 2

## 2022-08-08 MED ORDER — DIPHENHYDRAMINE HCL 50 MG/ML IJ SOLN: 50.0000 mg | Freq: Four times a day (QID) | INTRAMUSCULAR | Status: DC | PRN

## 2022-08-08 NOTE — Progress Notes (Signed)
[facility] MD Progress Note  08/08/2022 4:30 PM Brian Decker.  MRN:  [id number] Subjective: Follow-up for this 27 year old man with substance abuse recent mood instability.  Patient seen and he attended treatment team.  Still anxious.  Jittery a bit this afternoon.  Not delirious and not psychotic.  The wound on his leg is still bothering him but seems to be getting a bit better.  Very much appreciate medicine consult coming in looking at it and reassuring [place] of how it is doing. Principal Problem: Adjustment disorder with mixed disturbance of emotions and conduct Diagnosis: Principal Problem:   Adjustment disorder with mixed disturbance of emotions and conduct Active Problems:   Cocaine use   Amphetamine abuse (HCC)  Total Time spent with patient: 30 minutes  Past Psychiatric History: Past history of longstanding substance abuse problems  Past Medical History:  Past Medical History:  Diagnosis Date   Asthma    Cocaine abuse ([place])    Crack cocaine use    Hepatitis    Heroin abuse ([place]) 04/07/2020   Methamphetamine use ([place]) 04/07/2020   History reviewed. No pertinent surgical history. Family History: History reviewed. No pertinent family history. Family Psychiatric  History: See previous Social History:  Social History   Substance and Sexual Activity  Alcohol Use Yes   Comment: occ     Social History   Substance and Sexual Activity  Drug Use Yes   Types: Marijuana, IV, Cocaine, Methamphetamines   Comment: heroin    Social History   Socioeconomic History   Marital status: Single    Spouse name: Not on file   Number of children: Not on file   Years of education: Not on file   Highest education level: Not on file  Occupational History   Not on file  Tobacco Use   Smoking status: Every Day    Packs/day: 0.50    Types: Cigarettes   Smokeless tobacco: Never  Vaping Use   Vaping Use: Every day  Substance and Sexual Activity   Alcohol use: Yes    Comment: occ   Drug  use: Yes    Types: Marijuana, IV, Cocaine, Methamphetamines    Comment: heroin   Sexual activity: Yes    Birth control/protection: None  Other Topics Concern   Not on file  Social History Narrative   Not on file   Social Determinants of Health   Financial Resource Strain: Not on file  Food Insecurity: Unknown (08/06/2022)   Hunger Vital Sign    Worried About Running Out of Food in the Last Year: Patient refused    Ran Out of Food in the Last Year: Patient refused  Transportation Needs: Unknown (08/06/2022)   PRAPARE - Hydrologist (Medical): Patient refused    Lack of Transportation (Non-Medical): Patient refused  Physical Activity: Not on file  Stress: Not on file  Social Connections: Not on file   Additional Social History:                         Sleep: Fair  Appetite:  Fair  Current Medications: Current Facility-Administered Medications  Medication Dose Route Frequency Provider Last Rate Last Admin   acetaminophen (TYLENOL) tablet 650 mg  650 mg Oral Q6H PRN Nafeesah Lapaglia T, MD   650 mg at 08/08/22 1139   alum & mag hydroxide-simeth (MAALOX/MYLANTA) 200-200-20 MG/5ML suspension 30 mL  30 mL Oral Q4H PRN Jaxon Mynhier, Madie Reno, MD  ascorbic acid (VITAMIN C) tablet 500 mg  500 mg Oral BID Sugey Trevathan, Madie Reno, MD   500 mg at 08/08/22 1138   diphenhydrAMINE (BENADRYL) capsule 50 mg  50 mg Oral Q6H PRN Brookelyn Gaynor, Madie Reno, MD       Or   diphenhydrAMINE (BENADRYL) injection 50 mg  50 mg Intramuscular Q6H PRN Marajade Lei, Madie Reno, MD       doxycycline (VIBRA-TABS) tablet 100 mg  100 mg Oral Q12H Javaria Knapke T, MD   100 mg at 08/08/22 1138   famotidine (PEPCID) tablet 20 mg  20 mg Oral BID Eola Waldrep T, MD   20 mg at 08/08/22 1138   gabapentin (NEURONTIN) capsule 600 mg  600 mg Oral TID PRN Lucella Pommier, Madie Reno, MD       haloperidol (HALDOL) tablet 5 mg  5 mg Oral Q6H PRN Jaleyah Longhi T, MD       Or   haloperidol lactate (HALDOL) injection 5 mg  5 mg  Intramuscular Q6H PRN Mason Dibiasio, Madie Reno, MD       hydrOXYzine (ATARAX) tablet 50 mg  50 mg Oral TID PRN Bonner Larue T, MD   50 mg at 08/08/22 1139   magnesium hydroxide (MILK OF MAGNESIA) suspension 30 mL  30 mL Oral Daily PRN Leray Garverick, Madie Reno, MD       nicotine (NICODERM CQ - dosed in mg/24 hours) patch 21 mg  21 mg Transdermal Daily Taydon Nasworthy T, MD   21 mg at 08/08/22 1144   QUEtiapine (SEROQUEL) tablet 50 mg  50 mg Oral QHS [place] Fant T, MD        Lab Results: No results found for this or any previous visit (from the past 48 hour(s)).  Blood Alcohol level:  Lab Results  Component Value Date   ETH <10 08/03/2022   ETH <10 05/14/8526    Metabolic Disorder Labs: No results found for: "HGBA1C", "MPG" No results found for: "PROLACTIN" No results found for: "CHOL", "TRIG", "HDL", "CHOLHDL", "VLDL", "LDLCALC"  Physical Findings: AIMS:  , ,  ,  ,    CIWA:    COWS:     Musculoskeletal: Strength & Muscle Tone: within normal limits Gait & Station: normal Patient leans: N/A  Psychiatric Specialty Exam:  Presentation  General Appearance:  Disheveled  Eye Contact: Poor  Speech: Clear and Coherent  Speech Volume: Normal  Handedness: Right   Mood and Affect  Mood: Depressed; Hopeless; Worthless  Affect: Depressed; Flat; Tearful   Thought Process  Thought Processes: Coherent  Descriptions of Associations:Intact  Orientation:Full (Time, Place and Person)  Thought Content:Illogical  History of Schizophrenia/Schizoaffective disorder:No data recorded Duration of Psychotic Symptoms:No data recorded Hallucinations:No data recorded Ideas of Reference:None  Suicidal Thoughts:No data recorded Homicidal Thoughts:No data recorded  Sensorium  Memory: Immediate Good  Judgment: Poor  Insight: Poor   Executive Functions  Concentration: Fair  Attention Span: Fair  Recall: [place] of Knowledge: Fair  Language: Good   Psychomotor  Activity  Psychomotor Activity:No data recorded  Assets  Assets: Social Support; Resilience   Sleep  Sleep:No data recorded   Physical Exam: Physical Exam Vitals and nursing note reviewed.  Constitutional:      Appearance: Normal appearance.  HENT:     Head: Normocephalic and atraumatic.     Mouth/Throat:     Pharynx: Oropharynx is clear.  Eyes:     Pupils: Pupils are equal, round, and reactive to light.  Cardiovascular:     Rate and  Rhythm: Normal rate and regular rhythm.  Pulmonary:     Effort: Pulmonary effort is normal.     Breath sounds: Normal breath sounds.  Abdominal:     General: Abdomen is flat.     Palpations: Abdomen is soft.  Musculoskeletal:        General: Normal range of motion.       Legs:  Skin:    General: Skin is warm and dry.  Neurological:     General: No focal deficit present.     Mental Status: He is alert. Mental status is at baseline.  Psychiatric:        Attention and Perception: Attention normal.        Mood and Affect: Mood is anxious.        Speech: Speech normal.        Behavior: Behavior is cooperative.        Thought Content: Thought content normal.        Cognition and Memory: Cognition normal.        Judgment: Judgment is impulsive.    Review of Systems  Constitutional: Negative.   HENT: Negative.    Eyes: Negative.   Respiratory: Negative.    Cardiovascular: Negative.   Gastrointestinal: Negative.   Musculoskeletal: Negative.   Skin: Negative.   Neurological: Negative.   Psychiatric/Behavioral: Negative.     Blood pressure (!) 153/131, pulse 73, temperature 98 F (36.7 C), temperature source Oral, resp. rate 17, height 5\' 10"  (1.778 m), weight 68.9 kg, SpO2 99 %. Body mass index is 21.81 kg/m.   Treatment Plan Summary: Medication management and Plan patient is anxious but denies suicidal ideation.  Still a little disorganized in thinking but getting better.  Would benefit from being in the hospital at least over  the weekend to get stabilized and fully withdrawal before making decisions about what he is going to do next.  Appreciate medicine coming to see him.  Patient said he prefers Seroquel over trazodone for sleep and so the change was made.  I have also added gabapentin as a as needed for anxiety.  , MD 08/08/2022, 4:30 PM

## 2022-08-08 NOTE — Plan of Care (Signed)
D- Patient alert and oriented. Patient initially presented in an anxious, preoccupied mood on assessment stating that he didn't sleep last night and had complaints of left leg pain. Patient rated his pain a "7/10", in which he requested PRN pain medication from staff for relief. Patient denied depression, but endorsed anxiety, stating that he's anxious about "being here". Patient also denied SI, HI, AVH at this time. Patient had no stated goals for today.  A- Scheduled medications administered to patient, per MD orders. Support and encouragement provided.  Routine safety checks conducted every 15 minutes.  Patient informed to notify staff with problems or concerns.  R- No adverse drug reactions noted. Patient contracts for safety at this time. Patient compliant with medications. Patient receptive, calm, and cooperative. Patient interacts well with others on the unit. Patient remains safe at this time.  Problem: Education: Goal: Knowledge of General Education information will improve Description: Including pain rating scale, medication(s)/side effects and non-pharmacologic comfort measures Outcome: Progressing   Problem: Health Behavior/Discharge Planning: Goal: Ability to manage health-related needs will improve Outcome: Progressing   Problem: Clinical Measurements: Goal: Ability to maintain clinical measurements within normal limits will improve Outcome: Progressing Goal: Will remain free from infection Outcome: Progressing Goal: Diagnostic test results will improve Outcome: Progressing Goal: Respiratory complications will improve Outcome: Progressing Goal: Cardiovascular complication will be avoided Outcome: Progressing   Problem: Activity: Goal: Risk for activity intolerance will decrease Outcome: Progressing   Problem: Nutrition: Goal: Adequate nutrition will be maintained Outcome: Progressing   Problem: Coping: Goal: Level of anxiety will decrease Outcome: Progressing    Problem: Elimination: Goal: Will not experience complications related to bowel motility Outcome: Progressing Goal: Will not experience complications related to urinary retention Outcome: Progressing   Problem: Pain Managment: Goal: General experience of comfort will improve Outcome: Progressing   Problem: Safety: Goal: Ability to remain free from injury will improve Outcome: Progressing   Problem: Skin Integrity: Goal: Risk for impaired skin integrity will decrease Outcome: Progressing
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
## 2022-08-08 NOTE — Group Note (Signed)
LCSW Group Therapy Note  Group Date: 08/08/2022 Start Time: 1300 End Time: 1400   Type of Therapy and Topic:  Group Therapy - Healthy vs Unhealthy Coping Skills  Participation Level:  Did Not Attend   Description of Group The focus of this group was to determine what unhealthy coping techniques typically are used by group members and what healthy coping techniques would be helpful in coping with various problems. Patients were guided in becoming aware of the differences between healthy and unhealthy coping techniques. Patients were asked to identify 2-3 healthy coping skills they would like to learn to use more effectively.  Therapeutic Goals Patients learned that coping is what human beings do all day long to deal with various situations in their lives Patients defined and discussed healthy vs unhealthy coping techniques Patients identified their preferred coping techniques and identified whether these were healthy or unhealthy Patients determined 2-3 healthy coping skills they would like to become more familiar with and use more often. Patients provided support and ideas to each other   Summary of Patient Progress:  Patient did not attend group despite encouraged participation.   Therapeutic Modalities Cognitive Behavioral Therapy Motivational Interviewing  Doral Digangi W Heela Heishman, LCSWA 08/08/2022  2:45 PM   

## 2022-08-08 NOTE — BH IP Treatment Plan (Unsigned)
Interdisciplinary Treatment and Diagnostic Plan Update  08/08/2022 Time of Session: 09:43 Brian Decker. MRN: [id number]  Principal Diagnosis: Adjustment disorder with mixed disturbance of emotions and conduct  Secondary Diagnoses: Principal Problem:   Adjustment disorder with mixed disturbance of emotions and conduct Active Problems:   Cocaine use   Amphetamine abuse (HCC)   Current Medications:  Current Facility-Administered Medications  Medication Dose Route Frequency Provider Last Rate Last Admin   acetaminophen (TYLENOL) tablet 650 mg  650 mg Oral Q6H PRN Clapacs, John T, MD   650 mg at 08/07/22 2116   alum & mag hydroxide-simeth (MAALOX/MYLANTA) 200-200-20 MG/5ML suspension 30 mL  30 mL Oral Q4H PRN Clapacs, Madie Reno, MD       ascorbic acid (VITAMIN C) tablet 500 mg  500 mg Oral BID Clapacs, John T, MD   500 mg at 08/07/22 0900   doxycycline (VIBRA-TABS) tablet 100 mg  100 mg Oral Q12H Clapacs, John T, MD   100 mg at 08/07/22 2116   famotidine (PEPCID) tablet 20 mg  20 mg Oral BID Clapacs, John T, MD   20 mg at 08/07/22 0900   hydrOXYzine (ATARAX) tablet 50 mg  50 mg Oral TID PRN Clapacs, Madie Reno, MD   50 mg at 08/07/22 2116   magnesium hydroxide (MILK OF MAGNESIA) suspension 30 mL  30 mL Oral Daily PRN Clapacs, Madie Reno, MD       traZODone (DESYREL) tablet 100 mg  100 mg Oral QHS PRN Clapacs, Madie Reno, MD   100 mg at 08/07/22 2116   PTA Medications: Medications Prior to Admission  Medication Sig Dispense Refill Last Dose   doxycycline (VIBRA-TABS) 100 MG tablet Take 1 tablet (100 mg total) by mouth every 12 (twelve) hours for 5 days. 10 tablet 0    feeding supplement (ENSURE ENLIVE / ENSURE PLUS) LIQD Take 237 mLs by mouth 3 (three) times daily between meals. 353 mL 12    folic acid (FOLVITE) 1 MG tablet Take 1 tablet (1 mg total) by mouth daily.      Multiple Vitamin (MULTIVITAMIN WITH MINERALS) TABS tablet Take 1 tablet by mouth daily.      thiamine (VITAMIN B-1) 100 MG tablet  Take 1 tablet (100 mg total) by mouth daily.       Patient Stressors: Substance abuse    Patient Strengths: Motivation for treatment/growth   Treatment Modalities: Medication Management, Group therapy, Case management,  1 to 1 session with clinician, Psychoeducation, Recreational therapy.   Physician Treatment Plan for Primary Diagnosis: Adjustment disorder with mixed disturbance of emotions and conduct Long Term Goal(s): Improvement in symptoms so as ready for discharge   Short Term Goals: Ability to identify and develop effective coping behaviors will improve Ability to maintain clinical measurements within normal limits will improve Ability to verbalize feelings will improve Ability to demonstrate self-control will improve  Medication Management: Evaluate patient's response, side effects, and tolerance of medication regimen.  Therapeutic Interventions: 1 to 1 sessions, Unit Group sessions and Medication administration.  Evaluation of Outcomes: Not Met  Physician Treatment Plan for Secondary Diagnosis: Principal Problem:   Adjustment disorder with mixed disturbance of emotions and conduct Active Problems:   Cocaine use   Amphetamine abuse ([place])  Long Term Goal(s): Improvement in symptoms so as ready for discharge   Short Term Goals: Ability to identify and develop effective coping behaviors will improve Ability to maintain clinical measurements within normal limits will improve Ability to verbalize feelings will improve Ability to  demonstrate self-control will improve     Medication Management: Evaluate patient's response, side effects, and tolerance of medication regimen.  Therapeutic Interventions: 1 to 1 sessions, Unit Group sessions and Medication administration.  Evaluation of Outcomes: Not Met   RN Treatment Plan for Primary Diagnosis: Adjustment disorder with mixed disturbance of emotions and conduct Long Term Goal(s): Knowledge of disease and therapeutic  regimen to maintain health will improve  Short Term Goals: Ability to remain free from injury will improve, Ability to verbalize frustration and anger appropriately will improve, Ability to demonstrate self-control, Ability to participate in decision making will improve, Ability to verbalize feelings will improve, Ability to disclose and discuss suicidal ideas, Ability to identify and develop effective coping behaviors will improve, and Compliance with prescribed medications will improve  Medication Management: RN will administer medications as ordered by provider, will assess and evaluate patient's response and provide education to patient for prescribed medication. RN will report any adverse and/or side effects to prescribing provider.  Therapeutic Interventions: 1 on 1 counseling sessions, Psychoeducation, Medication administration, Evaluate responses to treatment, Monitor vital signs and CBGs as ordered, Perform/monitor CIWA, COWS, AIMS and Fall Risk screenings as ordered, Perform wound care treatments as ordered.  Evaluation of Outcomes: Not Met   LCSW Treatment Plan for Primary Diagnosis: Adjustment disorder with mixed disturbance of emotions and conduct Long Term Goal(s): Safe transition to appropriate next level of care at discharge, Engage patient in therapeutic group addressing interpersonal concerns.  Short Term Goals: Engage patient in aftercare planning with referrals and resources, Increase social support, Increase ability to appropriately verbalize feelings, Increase emotional regulation, Facilitate acceptance of mental health diagnosis and concerns, Facilitate patient progression through stages of change regarding substance use diagnoses and concerns, Identify triggers associated with mental health/substance abuse issues, and Increase skills for wellness and recovery  Therapeutic Interventions: Assess for all discharge needs, 1 to 1 time with Social worker, Explore available resources  and support systems, Assess for adequacy in community support network, Educate family and significant other(s) on suicide prevention, Complete Psychosocial Assessment, Interpersonal group therapy.  Evaluation of Outcomes: Not Met   Progress in Treatment: Attending groups: No. Participating in groups: No. Taking medication as prescribed: Yes. Toleration medication: Yes. Family/Significant other contact made: No, will contact:  if given permission. Patient understands diagnosis: Yes. Discussing patient identified problems/goals with staff: Yes. Medical problems stabilized or resolved: No. Denies suicidal/homicidal ideation: Yes. Issues/concerns per patient self-inventory: No. Other: none.  New problem(s) identified: No, Describe:  none identified.  New Short Term/Long Term Goal(s): detox, medication management for mood stabilization; elimination of SI thoughts; development of comprehensive mental wellness/sobriety plan.  Patient Goals:  "Just to find a little bit of self-motivation."  Discharge Plan or Barriers: CSW will assist pt with development of an appropriate aftercare/discharge plan.   Reason for Continuation of Hospitalization: Aggression Anxiety Depression Medication stabilization Suicidal ideation  Estimated Length of Stay: 1-7 days  Last 3 [place] Suicide Severity Risk Score: Flowsheet Row Admission (Current) from 08/06/2022 in [facility] from 07/27/2022 in [facility] to Hosp-Admission (Discharged) from 01/04/2021 in [facility] No Risk No Risk No Risk       Last PHQ 2/9 Scores:     No data to display          Scribe for Treatment Team: Shirl Harris, LCSW 08/08/2022 10:23 AM

## 2022-08-08 NOTE — Progress Notes (Signed)
Patient demanding to staff this shift wanting snacks. Patient loud, argumentative and aggressive. Noted shouting and yelling in staff face. Able to re-direct patient back to room. Patient requested prn for pain, sleep and anxiety. Given with good relief. Patient remained safe on unit with q 15 min checks.

## 2022-08-08 NOTE — Assessment & Plan Note (Signed)
Mr. Brian Decker was evaluated today for his left lower extremity cellulitis.  The area of erythema has persisted but there is no evidence of expansion of erythema at this time compared to prior images.  The lower extremity wound that was likely the nidus for cellulitis appears to be healing well.  No indication at this time for change in management, however given missed dose of doxycycline and acute worsening thereafter, would restart the clock with plans to complete a 7-day course starting from January 18.  Last day of antibiotics would be January 24.  In addition, I recommended that [name] follow-up with his PCP; if he is unable to see a primary care physician, he may follow-up in urgent care if his symptoms or not improving, if he develops fevers, or if the area of redness is spreading  - Continue doxycycline 100 mg to complete a 7-day course - Reevaluation precautions provided

## 2022-08-08 NOTE — Plan of Care (Signed)
  Problem: Education: Goal: Knowledge of General Education information will improve Description: Including pain rating scale, medication(s)/side effects and non-pharmacologic comfort measures Outcome: Progressing   Problem: Coping: Goal: Level of anxiety will decrease Outcome: Not Progressing

## 2022-08-08 NOTE — Progress Notes (Signed)
Patient tolerated dressing change well, without any issues. Patient remains safe on the unit.

## 2022-08-08 NOTE — Progress Notes (Signed)
Patient stated that he will come up and get his medication, but has not moved since saying so. MD will be notified during progression rounds.

## 2022-08-08 NOTE — Consult Note (Signed)
Initial Consultation Note   Patient: Brian Decker. ZTI:[id number] DOB: [birth year] PCP: Pcp, No DOA: 08/06/2022 DOS: the patient was seen and examined on 08/08/2022 Primary service: Clapacs, Madie Reno, MD  Referring physician: Dr. Weber Cooks Reason for consult: Cellulitis  Assessment/Plan: Assessment and Plan: Cellulitis of left lower extremity Brian Decker was evaluated today for his left lower extremity cellulitis.  The area of erythema has persisted but there is no evidence of expansion of erythema at this time compared to prior images.  The lower extremity wound that was likely the nidus for cellulitis appears to be healing well.  No indication at this time for change in management, however given missed dose of doxycycline and acute worsening thereafter, would restart the clock with plans to complete a 7-day course starting from January 18.  Last day of antibiotics would be January 24.  In addition, I recommended that Brian Decker follow-up with his PCP; if he is unable to see a primary care physician, he may follow-up in urgent care if his symptoms or not improving, if he develops fevers, or if the area of redness is spreading  - Continue doxycycline 100 mg to complete a 7-day course - Reevaluation precautions provided   TRH will sign off at present, please call us again when needed.  HPI: Brian Decker. is a 27 y.o. male with past medical history of polysubstance abuse who is being evaluated today due to cellulitis.  Per chart review, patient was admitted on 08/03/2022 at which time there was concern for sepsis secondary to left lower extremity cellulitis.  Sepsis was ultimately ruled out.  He was intubated for airway protection and adequate sedation.  Cellulitis initially treated with vancomycin, cefepime and metronidazole for patient was transitioned doxycycline.  Brian Decker states that yesterday, his left lower extremity was hurting him more wonders if it was due to a day of missed doses of  doxycycline.  He states that today, his lower extremity feels a lot better and he is not experiencing any increased pain.  He continues to have some drainage from the wounds but states this is improved.  He denies any fever chills, nausea, vomiting, abdominal pain.  Review of Systems: As mentioned in the history of present illness. All other systems reviewed and are negative. Past Medical History:  Diagnosis Date   Asthma    Cocaine abuse ([place])    Crack cocaine use    Hepatitis    Heroin abuse ([place]) 04/07/2020   Methamphetamine use ([place]) 04/07/2020   History reviewed. No pertinent surgical history. Social History:  reports that he has been smoking cigarettes. He has been smoking an average of .5 packs per day. He has never used smokeless tobacco. He reports current alcohol use. He reports current drug use. Drugs: Marijuana, IV, Cocaine, and Methamphetamines.  Allergies  Allergen Reactions   Bee Venom Anaphylaxis   Penicillins Anaphylaxis    Per Mom, tolerates cefepime 08/04/2022    History reviewed. No pertinent family history.  Prior to Admission medications   Medication Sig Start Date End Date Taking? Authorizing Provider  doxycycline (VIBRA-TABS) 100 MG tablet Take 1 tablet (100 mg total) by mouth every 12 (twelve) hours for 5 days. 08/06/22 08/11/22  Max Sane, MD  feeding supplement (ENSURE ENLIVE / ENSURE PLUS) LIQD Take 237 mLs by mouth 3 (three) times daily between meals. 08/06/22   Max Sane, MD  folic acid (FOLVITE) 1 MG tablet Take 1 tablet (1 mg total) by mouth daily. 08/07/22   Manuella Ghazi,  Vipul, MD  Multiple Vitamin (MULTIVITAMIN WITH MINERALS) TABS tablet Take 1 tablet by mouth daily. 08/07/22   Max Sane, MD  thiamine (VITAMIN B-1) 100 MG tablet Take 1 tablet (100 mg total) by mouth daily. 08/07/22   Max Sane, MD    Physical Exam: Vitals:   08/06/22 1840 08/07/22 0643 08/07/22 1815  BP: 134/69 129/86 (!) 153/131  Pulse: 77 (!) 105 73  Resp: 17    Temp: 98.9 F (37.2  C) 99 F (37.2 C) 98 F (36.7 C)  TempSrc: Oral Oral Oral  SpO2: 100% 97% 99%  Weight: 68.9 kg    Height: 5\' 10"  (1.778 m)     Physical Exam Vitals and nursing note reviewed.  Constitutional:      General: He is not in acute distress.    Appearance: He is normal weight. He is not toxic-appearing.  Cardiovascular:     Rate and Rhythm: Normal rate and regular rhythm.     Heart sounds: No murmur heard. Pulmonary:     Effort: Pulmonary effort is normal. No respiratory distress.     Breath sounds: Normal breath sounds.  Musculoskeletal:     Right lower leg: No edema.     Left lower leg: Edema (+1 pitting edema overlying the area of erythema.) present.  Skin:    General: Skin is warm and dry.     Comments: On the anterior aspect of the left lower extremity there is an extensive area of erythema that goes up to the knee and down above the ankle but does not radiate to the posterior aspect of the lower extremity.  There are 2 wounds, both approximately 1 cm wide with some serous drainage.  Compared to prior images, appears to be healing appropriately.  Neurological:     General: No focal deficit present.     Mental Status: He is alert.     Motor: No weakness.      Data Reviewed:  CBC obtained 2 days ago with WBC of 14.6, hemoglobin of 10.4, and platelets of 257.  Culture data obtained from wound of the left lower extremity with few MRSA and no anaerobes.  There are no new results to review at this time.   Family Communication: Updated Primary team communication: Primary team updated  Thank you very much for involving [place] in the care of your patient.  Author: Jose Persia, MD 08/08/2022 5:50 PM  For on call review [email or web address].

## 2022-08-09 DIAGNOSIS — F4325 Adjustment disorder with mixed disturbance of emotions and conduct: Secondary | ICD-10-CM | POA: Diagnosis not present

## 2022-08-09 LAB — AEROBIC/ANAEROBIC CULTURE W GRAM STAIN (SURGICAL/DEEP WOUND)

## 2022-08-09 MED ORDER — ASCORBIC ACID 500 MG PO TABS: 500.0000 mg | ORAL_TABLET | Freq: Two times a day (BID) | ORAL | 0 refills | Status: DC

## 2022-08-09 MED ORDER — QUETIAPINE FUMARATE 50 MG PO TABS: 50.0000 mg | ORAL_TABLET | Freq: Every day | ORAL | 0 refills | Status: DC

## 2022-08-09 MED ORDER — HYDROXYZINE HCL 50 MG PO TABS: 50.0000 mg | ORAL_TABLET | Freq: Three times a day (TID) | ORAL | 0 refills | Status: DC | PRN

## 2022-08-09 MED ORDER — IBUPROFEN 600 MG PO TABS
800.0000 mg | ORAL_TABLET | Freq: Three times a day (TID) | ORAL | Status: DC | PRN
  Administered 2022-08-09 – 2022-08-10 (×4): 800 mg via ORAL
  Filled 2022-08-09 (×4): qty 1

## 2022-08-09 MED ORDER — HYDROXYZINE HCL 50 MG PO TABS
50.0000 mg | ORAL_TABLET | Freq: Three times a day (TID) | ORAL | 0 refills | Status: DC | PRN
  Filled 2022-08-09: qty 30, 10d supply, fill #0

## 2022-08-09 MED ORDER — GABAPENTIN 300 MG PO CAPS
600.0000 mg | ORAL_CAPSULE | Freq: Three times a day (TID) | ORAL | 0 refills | Status: DC | PRN
  Filled 2022-08-09: qty 90, 15d supply, fill #0

## 2022-08-09 MED ORDER — GABAPENTIN 300 MG PO CAPS: 600.0000 mg | ORAL_CAPSULE | Freq: Three times a day (TID) | ORAL | 0 refills | Status: DC | PRN

## 2022-08-09 MED ORDER — DOXYCYCLINE HYCLATE 100 MG PO TABS
100.0000 mg | ORAL_TABLET | Freq: Two times a day (BID) | ORAL | Status: DC
  Administered 2022-08-09 – 2022-08-10 (×4): 100 mg via ORAL
  Filled 2022-08-09 (×7): qty 1

## 2022-08-09 MED ORDER — FAMOTIDINE 20 MG PO TABS: 20.0000 mg | ORAL_TABLET | Freq: Two times a day (BID) | ORAL | 0 refills | Status: DC

## 2022-08-09 MED ORDER — ASCORBIC ACID 500 MG PO TABS
500.0000 mg | ORAL_TABLET | Freq: Two times a day (BID) | ORAL | 0 refills | Status: DC
  Filled 2022-08-09: qty 60, 30d supply, fill #0

## 2022-08-09 MED ORDER — NICOTINE 21 MG/24HR TD PT24
21.0000 mg | MEDICATED_PATCH | Freq: Every day | TRANSDERMAL | 0 refills | Status: DC
  Filled 2022-08-09: qty 28, 28d supply, fill #0

## 2022-08-09 MED ORDER — FAMOTIDINE 20 MG PO TABS
20.0000 mg | ORAL_TABLET | Freq: Two times a day (BID) | ORAL | 0 refills | Status: DC
  Filled 2022-08-09: qty 60, 30d supply, fill #0

## 2022-08-09 MED ORDER — QUETIAPINE FUMARATE 50 MG PO TABS
50.0000 mg | ORAL_TABLET | Freq: Every day | ORAL | 0 refills | Status: DC
  Filled 2022-08-09: qty 30, 30d supply, fill #0

## 2022-08-09 MED ORDER — NICOTINE 21 MG/24HR TD PT24: 21.0000 mg | MEDICATED_PATCH | Freq: Every day | TRANSDERMAL | 0 refills | Status: DC

## 2022-08-09 MED ORDER — DOXYCYCLINE HYCLATE 100 MG PO TABS
100.0000 mg | ORAL_TABLET | Freq: Two times a day (BID) | ORAL | 0 refills | Status: DC
  Filled 2022-08-09: qty 16, 8d supply, fill #0

## 2022-08-09 NOTE — Progress Notes (Signed)
D: Patient alert and oriented. Patient endorses 7/10 lower left leg pain. Patient denies depression. Patient endorsed anxiety. PRN medication requested by patient has been given throughout shift. Patient denies SI/HI/AVH. Patient refused scheduled 0800 medications after seeing that antibiotic was not included in the medications given. Patient compliant with the other scheduled medication throughout the shift.  Patient dressing was changed at 1210.  Patient remains on contact precautions due to MRSA on the leg.   A: Scheduled medications administered to patient, per MD orders.  Support and encouragement provided to patient.  Q15 minute safety checks maintained.   R: Patient compliant with medication administration and treatment plan. No adverse drug reactions noted. Patient remains safe on the unit at this time.

## 2022-08-09 NOTE — Progress Notes (Signed)
[facility] MD Progress Note  08/09/2022 12:38 PM Brian Decker.  MRN:  [id number] Subjective: Follow-up patient with adjustments, mood symptoms and substance abuse.  Patient has rested and says he is feeling better.  Denies suicidal thoughts.  Leg is feeling no worse although he and nursing both note that the redness may be a little more visible.  Slept better. Principal Problem: Adjustment disorder with mixed disturbance of emotions and conduct Diagnosis: Principal Problem:   Adjustment disorder with mixed disturbance of emotions and conduct Active Problems:   Cocaine use   Cellulitis of left lower extremity   Amphetamine abuse ([place])  Total Time spent with patient: 30 minutes  Past Psychiatric History: Past history of longstanding substance use issues  Past Medical History:  Past Medical History:  Diagnosis Date   Asthma    Cocaine abuse ([place])    Crack cocaine use    Hepatitis    Heroin abuse ([place]) 04/07/2020   Methamphetamine use ([place]) 04/07/2020   History reviewed. No pertinent surgical history. Family History: History reviewed. No pertinent family history. Family Psychiatric  History: See previous Social History:  Social History   Substance and Sexual Activity  Alcohol Use Yes   Comment: occ     Social History   Substance and Sexual Activity  Drug Use Yes   Types: Marijuana, IV, Cocaine, Methamphetamines   Comment: heroin    Social History   Socioeconomic History   Marital status: Single    Spouse name: Not on file   Number of children: Not on file   Years of education: Not on file   Highest education level: Not on file  Occupational History   Not on file  Tobacco Use   Smoking status: Every Day    Packs/day: 0.50    Types: Cigarettes   Smokeless tobacco: Never  Vaping Use   Vaping Use: Every day  Substance and Sexual Activity   Alcohol use: Yes    Comment: occ   Drug use: Yes    Types: Marijuana, IV, Cocaine, Methamphetamines    Comment: heroin    Sexual activity: Yes    Birth control/protection: None  Other Topics Concern   Not on file  Social History Narrative   Not on file   Social Determinants of Health   Financial Resource Strain: Not on file  Food Insecurity: Unknown (08/06/2022)   Hunger Vital Sign    Worried About Running Out of Food in the Last Year: Patient refused    [place] in the Last Year: Patient refused  Transportation Needs: Unknown (08/06/2022)   [place] - Hydrologist (Medical): Patient refused    Lack of Transportation (Non-Medical): Patient refused  Physical Activity: Not on file  Stress: Not on file  Social Connections: Not on file   Additional Social History:                         Sleep: Fair  Appetite:  Fair  Current Medications: Current Facility-Administered Medications  Medication Dose Route Frequency Provider Last Rate Last Admin   acetaminophen (TYLENOL) tablet 650 mg  650 mg Oral Q6H PRN Lauralynn Loeb T, MD   650 mg at 08/09/22 0948   alum & mag hydroxide-simeth (MAALOX/MYLANTA) 200-200-20 MG/5ML suspension 30 mL  30 mL Oral Q4H PRN Rhona Fusilier, Madie Reno, MD       ascorbic acid (VITAMIN C) tablet 500 mg  500 mg Oral BID Alannis Hsia  T, MD   500 mg at 08/08/22 1702   diphenhydrAMINE (BENADRYL) capsule 50 mg  50 mg Oral Q6H PRN Leeman Johnsey, Jackquline Denmark, MD       Or   diphenhydrAMINE (BENADRYL) injection 50 mg  50 mg Intramuscular Q6H PRN Jakaree Pickard T, MD       famotidine (PEPCID) tablet 20 mg  20 mg Oral BID Rashanda Magloire T, MD   20 mg at 08/08/22 1701   gabapentin (NEURONTIN) capsule 600 mg  600 mg Oral TID PRN Giacomo Valone, Jackquline Denmark, MD   600 mg at 08/08/22 1701   haloperidol (HALDOL) tablet 5 mg  5 mg Oral Q6H PRN Song Myre, Jackquline Denmark, MD       Or   haloperidol lactate (HALDOL) injection 5 mg  5 mg Intramuscular Q6H PRN Derrion Tritz, Jackquline Denmark, MD       hydrOXYzine (ATARAX) tablet 50 mg  50 mg Oral TID PRN Keir Viernes T, MD   50 mg at 08/09/22 0948   magnesium hydroxide  (MILK OF MAGNESIA) suspension 30 mL  30 mL Oral Daily PRN Ursala Cressy T, MD       nicotine (NICODERM CQ - dosed in mg/24 hours) patch 21 mg  21 mg Transdermal Daily Holly Iannaccone, Jackquline Denmark, MD   21 mg at 08/09/22 0943   QUEtiapine (SEROQUEL) tablet 50 mg  50 mg Oral QHS Karlei Waldo, Jackquline [place], MD   50 mg at 08/08/22 2122    Lab Results: No results found for this or any previous visit (from the past 48 hour(s)).  Blood Alcohol level:  Lab Results  Component Value Date   ETH <10 08/03/2022   ETH <10 07/27/2022    Metabolic Disorder Labs: No results found for: "HGBA1C", "MPG" No results found for: "PROLACTIN" No results found for: "CHOL", "TRIG", "HDL", "CHOLHDL", "VLDL", "LDLCALC"  Physical Findings: AIMS:  , ,  ,  ,    CIWA:    COWS:     Musculoskeletal: Strength & Muscle Tone: within normal limits Gait & Station: normal Patient leans: N/A  Psychiatric Specialty Exam:  Presentation  General Appearance:  Disheveled  Eye Contact: Poor  Speech: Clear and Coherent  Speech Volume: Normal  Handedness: Right   Mood and Affect  Mood: Depressed; Hopeless; Worthless  Affect: Depressed; Flat; Tearful   Thought Process  Thought Processes: Coherent  Descriptions of Associations:Intact  Orientation:Full (Time, Place and Person)  Thought Content:Illogical  History of Schizophrenia/Schizoaffective disorder:No data recorded Duration of Psychotic Symptoms:No data recorded Hallucinations:No data recorded Ideas of Reference:None  Suicidal Thoughts:No data recorded Homicidal Thoughts:No data recorded  Sensorium  Memory: Immediate Good  Judgment: Poor  Insight: Poor   Executive Functions  Concentration: Fair  Attention Span: Fair  Recall: Fair  Fund of Knowledge: Fair  Language: Good   Psychomotor Activity  Psychomotor Activity:No data recorded  Assets  Assets: Social Support; Resilience   Sleep  Sleep:No data recorded   Physical  Exam: Physical Exam Vitals and nursing note reviewed.  Constitutional:      Appearance: Normal appearance.  HENT:     Head: Normocephalic and atraumatic.     Mouth/Throat:     Pharynx: Oropharynx is clear.  Eyes:     Pupils: Pupils are equal, round, and reactive to light.  Cardiovascular:     Rate and Rhythm: Normal rate and regular rhythm.  Pulmonary:     Effort: Pulmonary effort is normal.     Breath sounds: Normal breath sounds.  Abdominal:  General: Abdomen is flat.     Palpations: Abdomen is soft.  Musculoskeletal:        General: Normal range of motion.  Skin:    General: Skin is warm and dry.       Neurological:     General: No focal deficit present.     Mental Status: He is alert. Mental status is at baseline.  Psychiatric:        Attention and Perception: Attention normal.        Mood and Affect: Mood normal.        Speech: Speech normal.        Behavior: Behavior normal.        Thought Content: Thought content normal.        Cognition and Memory: Cognition normal.        Judgment: Judgment normal.    Review of Systems  Constitutional: Negative.   HENT: Negative.    Eyes: Negative.   Respiratory: Negative.    Cardiovascular: Negative.   Gastrointestinal: Negative.   Musculoskeletal: Negative.   Skin: Negative.   Neurological: Negative.   Psychiatric/Behavioral: Negative.     Blood pressure (!) 153/100, pulse 69, temperature 98.6 F (37 C), temperature source Oral, resp. rate 17, height 5\' 10"  (1.778 m), weight 68.9 kg, SpO2 100 %. Body mass index is 21.81 kg/m.   Treatment Plan Summary: Medication management and Plan did better with the Seroquel.  I checked the note from the hospitalist.  Her note clearly states that she wanted him to continue on doxycycline at least for another 4 days.  For some reason the order was discontinued however.  I am restarting the doxycycline.  We will start making preparations to get ready for discharge possibly by  Monday.  His blood pressure was quite high this morning but that seems to probably be an outlier I am not going to start any medicine for it.  Alethia Berthold, MD 08/09/2022, 12:38 PM

## 2022-08-09 NOTE — Plan of Care (Signed)
  Problem: Education: Goal: Knowledge of General Education information will improve Description: Including pain rating scale, medication(s)/side effects and non-pharmacologic comfort measures Outcome: Progressing   Problem: Nutrition: Goal: Adequate nutrition will be maintained Outcome: Progressing   Problem: Coping: Goal: Level of anxiety will decrease Outcome: Progressing   

## 2022-08-09 NOTE — [facility] Suicide Risk Assessment (Signed)
[place] INPATIENT:  Family/Significant Other Suicide Prevention Education  Suicide Prevention Education:  Contact Attempts: Einar Pheasant, [phone], has been identified by the patient as the family member/significant other with whom the patient will be residing, and identified as the person(s) who will aid the patient in the event of a mental health crisis.  With written consent from the patient, two attempts were made to provide suicide prevention education, prior to and/or following the patient's discharge.  We were unsuccessful in providing suicide prevention education.  A suicide education pamphlet was given to the patient to share with family/significant other.  Date and time of first attempt:08/09/22, 1100am Date and time of second attempt:  Joanne Chars 08/09/2022, 11:02 AM

## 2022-08-09 NOTE — Plan of Care (Signed)

## 2022-08-09 NOTE — Progress Notes (Signed)
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 Left lower leg was bandaged and wrapped it still remains reddened. He denies SI, HI & AVH. He was cooperative with medications.

## 2022-08-09 NOTE — [facility] Group Notes (Signed)
LCSW Group Therapy Note   08/09/2022 1:15pm   Type of Therapy and Topic:  Group Therapy:  Overcoming Obstacles   Participation Level:  Did Not Attend   Description of Group:    In this group patients will be encouraged to explore what they see as obstacles to their own wellness and recovery. They will be guided to discuss their thoughts, feelings, and behaviors related to these obstacles. The group will process together ways to cope with barriers, with attention given to specific choices patients can make. Each patient will be challenged to identify changes they are motivated to make in order to overcome their obstacles. This group will be process-oriented, with patients participating in exploration of their own experiences as well as giving and receiving support and challenge from other group members.   Therapeutic Goals: Patient will identify personal and current obstacles as they relate to admission. Patient will identify barriers that currently interfere with their wellness or overcoming obstacles.  Patient will identify feelings, thought process and behaviors related to these barriers. Patient will identify two changes they are willing to make to overcome these obstacles:      Summary of Patient Progress      Therapeutic Modalities:   Cognitive Behavioral Therapy Solution Focused Therapy Motivational Interviewing Relapse Prevention Therapy  Kaleen Rochette Jon, LCSW 08/09/2022 2:43 PM  

## 2022-08-10 DIAGNOSIS — F4325 Adjustment disorder with mixed disturbance of emotions and conduct: Secondary | ICD-10-CM | POA: Diagnosis not present

## 2022-08-10 MED ORDER — NICOTINE POLACRILEX 2 MG MT GUM
2.0000 mg | CHEWING_GUM | OROMUCOSAL | Status: DC | PRN
  Administered 2022-08-10 (×2): 2 mg via ORAL
  Filled 2022-08-10 (×3): qty 1

## 2022-08-10 NOTE — Progress Notes (Signed)
Received his medication without incident. Presents restless and anxious with animated engagement. Denies si hi avh depression.  He endorsed pain in Left leg and received pain medication to help with symptoms.  Leg was cleaned and re wrapped.  Q15 minute safety checks in place.     C Butler-Nicholson, LPN

## 2022-08-10 NOTE — Progress Notes (Signed)
[facility] MD Progress Note  08/10/2022 11:46 AM Brian Decker.  MRN:  [id number] Subjective: Patient seen.  No new complaints.  Reports that his mood is feeling "great".  Denies any suicidal ideation.  No evidence psychosis.  No new physical complaints Principal Problem: Adjustment disorder with mixed disturbance of emotions and conduct Diagnosis: Principal Problem:   Adjustment disorder with mixed disturbance of emotions and conduct Active Problems:   Cocaine use   Cellulitis of left lower extremity   Amphetamine abuse ([place])  Total Time spent with patient: 20 minutes  Past Psychiatric History: Past history of longstanding recurrent substance use issues  Past Medical History:  Past Medical History:  Diagnosis Date   Asthma    Cocaine abuse ([place])    Crack cocaine use    Hepatitis    Heroin abuse ([place]) 04/07/2020   Methamphetamine use ([place]) 04/07/2020   History reviewed. No pertinent surgical history. Family History: History reviewed. No pertinent family history. Family Psychiatric  History: See previous Social History:  Social History   Substance and Sexual Activity  Alcohol Use Yes   Comment: occ     Social History   Substance and Sexual Activity  Drug Use Yes   Types: Marijuana, IV, Cocaine, Methamphetamines   Comment: heroin    Social History   Socioeconomic History   Marital status: Single    Spouse name: Not on file   Number of children: Not on file   Years of education: Not on file   Highest education level: Not on file  Occupational History   Not on file  Tobacco Use   Smoking status: Every Day    Packs/day: 0.50    Types: Cigarettes   Smokeless tobacco: Never  Vaping Use   Vaping Use: Every day  Substance and Sexual Activity   Alcohol use: Yes    Comment: occ   Drug use: Yes    Types: Marijuana, IV, Cocaine, Methamphetamines    Comment: heroin   Sexual activity: Yes    Birth control/protection: None  Other Topics Concern   Not on file  Social  History Narrative   Not on file   Social Determinants of Health   Financial Resource Strain: Not on file  Food Insecurity: Unknown (08/06/2022)   Hunger Vital Sign    Worried About Running Out of Food in the Last Year: Patient refused    [place] in the Last Year: Patient refused  Transportation Needs: Unknown (08/06/2022)   [place] - Hydrologist (Medical): Patient refused    Lack of Transportation (Non-Medical): Patient refused  Physical Activity: Not on file  Stress: Not on file  Social Connections: Not on file   Additional Social History:                         Sleep: Fair  Appetite:  Fair  Current Medications: Current Facility-Administered Medications  Medication Dose Route Frequency Provider Last Rate Last Admin   acetaminophen (TYLENOL) tablet 650 mg  650 mg Oral Q6H PRN Drey Shaff T, MD   650 mg at 08/09/22 0948   alum & mag hydroxide-simeth (MAALOX/MYLANTA) 200-200-20 MG/5ML suspension 30 mL  30 mL Oral Q4H PRN Nou Chard, Madie Reno, MD       ascorbic acid (VITAMIN C) tablet 500 mg  500 mg Oral BID Giulio Bertino T, MD   500 mg at 08/10/22 1122   diphenhydrAMINE (BENADRYL) capsule 50 mg  50  mg Oral Q6H PRN Nhi Butrum, Madie Reno, MD       Or   diphenhydrAMINE (BENADRYL) injection 50 mg  50 mg Intramuscular Q6H PRN Ridhima Golberg T, MD       doxycycline (VIBRA-TABS) tablet 100 mg  100 mg Oral Q12H Norris Bodley T, MD   100 mg at 08/10/22 1121   famotidine (PEPCID) tablet 20 mg  20 mg Oral BID Jedaiah Rathbun T, MD   20 mg at 08/10/22 1122   gabapentin (NEURONTIN) capsule 600 mg  600 mg Oral TID PRN Brandyn Lowrey T, MD   600 mg at 08/09/22 2115   haloperidol (HALDOL) tablet 5 mg  5 mg Oral Q6H PRN Eino Whitner, Madie Reno, MD       Or   haloperidol lactate (HALDOL) injection 5 mg  5 mg Intramuscular Q6H PRN Jolin Benavides, Madie Reno, MD       hydrOXYzine (ATARAX) tablet 50 mg  50 mg Oral TID PRN Laytoya Ion T, MD   50 mg at 08/10/22 1124   ibuprofen  (ADVIL) tablet 800 mg  800 mg Oral Q8H PRN Jarmarcus Wambold T, MD   800 mg at 08/09/22 2114   magnesium hydroxide (MILK OF MAGNESIA) suspension 30 mL  30 mL Oral Daily PRN Mead Slane, Madie Reno, MD       nicotine (NICODERM CQ - dosed in mg/24 hours) patch 21 mg  21 mg Transdermal Daily Treniyah Lynn T, MD   21 mg at 08/10/22 1123   QUEtiapine (SEROQUEL) tablet 50 mg  50 mg Oral QHS Mory Herrman, Madie Reno, MD   50 mg at 08/09/22 2115    Lab Results: No results found for this or any previous visit (from the past 48 hour(s)).  Blood Alcohol level:  Lab Results  Component Value Date   ETH <10 08/03/2022   ETH <10 78/67/5449    Metabolic Disorder Labs: No results found for: "HGBA1C", "MPG" No results found for: "PROLACTIN" No results found for: "CHOL", "TRIG", "HDL", "CHOLHDL", "VLDL", "LDLCALC"  Physical Findings: AIMS:  , ,  ,  ,    CIWA:    COWS:     Musculoskeletal: Strength & Muscle Tone: within normal limits Gait & Station: normal Patient leans: N/A  Psychiatric Specialty Exam:  Presentation  General Appearance:  Disheveled  Eye Contact: Poor  Speech: Clear and Coherent  Speech Volume: Normal  Handedness: Right   Mood and Affect  Mood: Depressed; Hopeless; Worthless  Affect: Depressed; Flat; Tearful   Thought Process  Thought Processes: Coherent  Descriptions of Associations:Intact  Orientation:Full (Time, Place and Person)  Thought Content:Illogical  History of Schizophrenia/Schizoaffective disorder:No data recorded Duration of Psychotic Symptoms:No data recorded Hallucinations:No data recorded Ideas of Reference:None  Suicidal Thoughts:No data recorded Homicidal Thoughts:No data recorded  Sensorium  Memory: Immediate Good  Judgment: Poor  Insight: Poor   Executive Functions  Concentration: Fair  Attention Span: Fair  Recall: [place] of Knowledge: Fair  Language: Good   Psychomotor Activity  Psychomotor Activity:No data  recorded  Assets  Assets: Social Support; Resilience   Sleep  Sleep:No data recorded   Physical Exam: Physical Exam Vitals and nursing note reviewed.  Constitutional:      Appearance: Normal appearance.  HENT:     Head: Normocephalic and atraumatic.     Mouth/Throat:     Pharynx: Oropharynx is clear.  Eyes:     Pupils: Pupils are equal, round, and reactive to light.  Cardiovascular:     Rate and Rhythm: Normal  rate and regular rhythm.  Pulmonary:     Effort: Pulmonary effort is normal.     Breath sounds: Normal breath sounds.  Abdominal:     General: Abdomen is flat.     Palpations: Abdomen is soft.  Musculoskeletal:        General: Normal range of motion.  Skin:    General: Skin is warm and dry.       Neurological:     General: No focal deficit present.     Mental Status: He is alert. Mental status is at baseline.  Psychiatric:        Mood and Affect: Mood normal.        Thought Content: Thought content normal.    Review of Systems  Constitutional: Negative.   HENT: Negative.    Eyes: Negative.   Respiratory: Negative.    Cardiovascular: Negative.   Gastrointestinal: Negative.   Musculoskeletal: Negative.   Skin: Negative.   Neurological: Negative.   Psychiatric/Behavioral: Negative.     Blood pressure (!) 138/96, pulse 73, temperature 98 F (36.7 C), temperature source Oral, resp. rate 17, height 5\' 10"  (1.778 m), weight 68.9 kg, SpO2 100 %. Body mass index is 21.81 kg/m.   Treatment Plan Summary: Medication management and Plan no change to medication.  Added as needed high dose Motrin at his request for pain medicine yesterday.  Continue with plan for likely discharge tomorrow.  , MD 08/10/2022, 11:46 AM

## 2022-08-10 NOTE — Progress Notes (Signed)
Patient refused evening vitals.

## 2022-08-10 NOTE — Progress Notes (Signed)
MD was notified and ok with patient taking his scheduled medication once he gets up and feels up to it.

## 2022-08-10 NOTE — Progress Notes (Signed)
Patient came up to nurses station requesting to have his leg wrapped, being that he just got out of the shower. This writer placed some 2x2 gauze over his wound and placed a 4x4 gauze sponge over that, and wrapped his leg with Kerlix roll. Patient tolerated dressing change well, without any issues. Patient remains safe on the unit.

## 2022-08-10 NOTE — Plan of Care (Signed)
D- Patient alert and oriented. Patient presents in a pleasant mood on assessment reporting that he slept good last night and had no complaints to voice to this Probation officer. Although patient rated his pain a "2/10", he stated that his leg is "feeling much better today, than it has been". Patient did not request any PRN pain medication from this writer. Patient denies depression, but endorsed anxiety, rating it a "5/10". Patient states "my environment and the people I'm around", is why he's feeling this way. Patient also denies SI, HI, AVH at this time. Per his self-inventory, patient's stated goal for today is "go home", in which he told this Probation officer that he will be discharging tomorrow.  A- Scheduled medications administered to patient, per MD orders. Support and encouragement provided.  Routine safety checks conducted every 15 minutes.  Patient informed to notify staff with problems or concerns.  R- No adverse drug reactions noted. Patient contracts for safety at this time. Patient compliant with medications. Patient receptive, calm, and cooperative. Patient interacts well with others on the unit. Patient remains safe at this time.  Problem: Education: Goal: Knowledge of General Education information will improve Description: Including pain rating scale, medication(s)/side effects and non-pharmacologic comfort measures Outcome: Progressing   Problem: Health Behavior/Discharge Planning: Goal: Ability to manage health-related needs will improve Outcome: Progressing   Problem: Clinical Measurements: Goal: Ability to maintain clinical measurements within normal limits will improve Outcome: Progressing Goal: Will remain free from infection Outcome: Progressing Goal: Diagnostic test results will improve Outcome: Progressing Goal: Respiratory complications will improve Outcome: Progressing Goal: Cardiovascular complication will be avoided Outcome: Progressing   Problem: Activity: Goal: Risk for  activity intolerance will decrease Outcome: Progressing   Problem: Nutrition: Goal: Adequate nutrition will be maintained Outcome: Progressing   Problem: Coping: Goal: Level of anxiety will decrease Outcome: Progressing   Problem: Elimination: Goal: Will not experience complications related to bowel motility Outcome: Progressing Goal: Will not experience complications related to urinary retention Outcome: Progressing   Problem: Pain Managment: Goal: General experience of comfort will improve Outcome: Progressing   Problem: Safety: Goal: Ability to remain free from injury will improve Outcome: Progressing   Problem: Skin Integrity: Goal: Risk for impaired skin integrity will decrease Outcome: Progressing

## 2022-08-10 NOTE — Progress Notes (Signed)
This writer went to get patient for scheduled morning medication. He stated to this writer "not right now". MD will be notified during progression rounds.

## 2022-08-10 NOTE — [facility] Group Notes (Signed)
[place] Group Notes:  (Nursing/MHT/Case Management/Adjunct)  Date:  08/10/2022  Time:  11:02 AM  Type of Therapy:   community meeting  Participation Level:  Did Not Attend    Antonieta Pert 08/10/2022, 11:02 AM

## 2022-08-11 ENCOUNTER — Other Ambulatory Visit: Payer: Self-pay

## 2022-08-11 DIAGNOSIS — F4325 Adjustment disorder with mixed disturbance of emotions and conduct: Secondary | ICD-10-CM | POA: Diagnosis not present

## 2022-08-11 MED ORDER — NICOTINE POLACRILEX 2 MG MT GUM: 2.0000 mg | CHEWING_GUM | OROMUCOSAL | 0 refills | Status: DC | PRN

## 2022-08-11 NOTE — Plan of Care (Signed)
  Problem: Education: Goal: Knowledge of General Education information will improve Description: Including pain rating scale, medication(s)/side effects and non-pharmacologic comfort measures Outcome: Progressing   Problem: Health Behavior/Discharge Planning: Goal: Ability to manage health-related needs will improve Outcome: Progressing   Problem: Clinical Measurements: Goal: Ability to maintain clinical measurements within normal limits will improve Outcome: Progressing Goal: Will remain free from infection Outcome: Progressing Goal: Diagnostic test results will improve Outcome: Progressing Goal: Respiratory complications will improve Outcome: Progressing Goal: Cardiovascular complication will be avoided Outcome: Progressing   Problem: Skin Integrity: Goal: Risk for impaired skin integrity will decrease Outcome: Progressing   Problem: Safety: Goal: Ability to remain free from injury will improve Outcome: Progressing   Problem: Pain Managment: Goal: General experience of comfort will improve Outcome: Progressing   Problem: Elimination: Goal: Will not experience complications related to bowel motility Outcome: Progressing Goal: Will not experience complications related to urinary retention Outcome: Progressing

## 2022-08-11 NOTE — Discharge Summary (Signed)
Physician Discharge Summary Note  Patient:  Brian Decker. is an 27 y.o., male MRN:  [id number] DOB:  June 11, 1996 Patient phone:  [phone] (home)  Patient address:   [address] [place] [address],  Total Time spent with patient: 30 minutes  Date of Admission:  08/06/2022 Date of Discharge: 08/11/2022  Reason for Admission: Admitted in transfer from the medical service after stabilization for an overdose of unclear intent complicated by serious substance abuse and skin infection.  Principal Problem: Adjustment disorder with mixed disturbance of emotions and conduct Discharge Diagnoses: Principal Problem:   Adjustment disorder with mixed disturbance of emotions and conduct Active Problems:   Cocaine use   Cellulitis of left lower extremity   Amphetamine abuse (HCC)   Past Psychiatric History: Past history of longstanding substance abuse problems.  Had recent sobriety but relapsed at the beginning of January followed by a long binge.  History of statements of suicidality in the past.  History of mood instability largely related to drug abuse.  Past Medical History:  Past Medical History:  Diagnosis Date   Asthma    Cocaine abuse ([place])    Crack cocaine use    Hepatitis    Heroin abuse ([place]) 04/07/2020   Methamphetamine use ([place]) 04/07/2020   History reviewed. No pertinent surgical history. Family History: History reviewed. No pertinent family history. Family Psychiatric  History: See previous Social History:  Social History   Substance and Sexual Activity  Alcohol Use Yes   Comment: occ     Social History   Substance and Sexual Activity  Drug Use Yes   Types: Marijuana, IV, Cocaine, Methamphetamines   Comment: heroin    Social History   Socioeconomic History   Marital status: Single    Spouse name: Not on file   Number of children: Not on file   Years of education: Not on file   Highest education level: Not on file  Occupational History   Not on file   Tobacco Use   Smoking status: Every Day    Packs/day: 0.50    Types: Cigarettes   Smokeless tobacco: Never  Vaping Use   Vaping Use: Every day  Substance and Sexual Activity   Alcohol use: Yes    Comment: occ   Drug use: Yes    Types: Marijuana, IV, Cocaine, Methamphetamines    Comment: heroin   Sexual activity: Yes    Birth control/protection: None  Other Topics Concern   Not on file  Social History Narrative   Not on file   Social Determinants of Health   Financial Resource Strain: Not on file  Food Insecurity: Unknown (08/06/2022)   Hunger Vital Sign    Worried About Running Out of Food in the Last Year: Patient refused    Ran Out of Food in the Last Year: Patient refused  Transportation Needs: Unknown (08/06/2022)   PRAPARE - Hydrologist (Medical): Patient refused    Lack of Transportation (Non-Medical): Patient refused  Physical Activity: Not on file  Stress: Not on file  Social Connections: Not on file    Hospital Course: Patient admitted to psychiatric unit.  Initially irritable but not violent or threatening.  Mood improved during his time here after he rested.  Patient was continued on his antibiotic and regular outpatient medications.  Did not display any dangerous or suicidal behavior in the hospital.  He has consistently stated that his plan at discharge is to turn himself into law enforcement  for his probation violation.  At the time of discharge he is stating that he is actually going to go to [place] and visit his mother briefly before turning himself in.  Patient has been reminded that he had been doing quite well for several months and that he can get back into that condition if he focuses on staying sober and getting into a structured treatment.  No evidence of acute dangerousness or psychosis at discharge.  Medicine was consulted while he was here for his leg wound and recommended continuing another course of the doxycycline and  regular dressing changes.  Physical Findings: AIMS:  , ,  ,  ,    CIWA:    COWS:     Musculoskeletal: Strength & Muscle Tone: within normal limits Gait & Station: normal Patient leans: N/A   Psychiatric Specialty Exam:  Presentation  General Appearance:  Disheveled  Eye Contact: Poor  Speech: Clear and Coherent  Speech Volume: Normal  Handedness: Right   Mood and Affect  Mood: Depressed; Hopeless; Worthless  Affect: Depressed; Flat; Tearful   Thought Process  Thought Processes: Coherent  Descriptions of Associations:Intact  Orientation:Full (Time, Place and Person)  Thought Content:Illogical  History of Schizophrenia/Schizoaffective disorder:No data recorded Duration of Psychotic Symptoms:No data recorded Hallucinations:No data recorded Ideas of Reference:None  Suicidal Thoughts:No data recorded Homicidal Thoughts:No data recorded  Sensorium  Memory: Immediate Good  Judgment: Poor  Insight: Poor   Executive Functions  Concentration: Fair  Attention Span: Fair  Recall: Fair  Fund of Knowledge: Fair  Language: Good   Psychomotor Activity  Psychomotor Activity:No data recorded  Assets  Assets: Social Support; Resilience   Sleep  Sleep:No data recorded   Physical Exam: Physical Exam Vitals and nursing note reviewed.  Constitutional:      Appearance: Normal appearance.  HENT:     Head: Normocephalic and atraumatic.     Mouth/Throat:     Pharynx: Oropharynx is clear.  Eyes:     Pupils: Pupils are equal, round, and reactive to light.  Cardiovascular:     Rate and Rhythm: Normal rate and regular rhythm.  Pulmonary:     Effort: Pulmonary effort is normal.     Breath sounds: Normal breath sounds.  Abdominal:     General: Abdomen is flat.     Palpations: Abdomen is soft.  Musculoskeletal:        General: Normal range of motion.  Skin:    General: Skin is warm and dry.       Neurological:     General: No  focal deficit present.     Mental Status: He is alert. Mental status is at baseline.  Psychiatric:        Attention and Perception: Attention normal.        Mood and Affect: Mood normal.        Speech: Speech normal.        Behavior: Behavior normal.        Thought Content: Thought content normal.        Cognition and Memory: Cognition normal.        Judgment: Judgment normal.    ROS Blood pressure (!) 140/94, pulse 96, temperature 98.2 F (36.8 C), temperature source Oral, resp. rate 17, height 5\' 10"  (1.778 m), weight 68.9 kg, SpO2 100 %. Body mass index is 21.81 kg/m.   Social History   Tobacco Use  Smoking Status Every Day   Packs/day: 0.50   Types: Cigarettes  Smokeless Tobacco Never  Tobacco Cessation:  A prescription for an FDA-approved tobacco cessation medication provided at discharge   Blood Alcohol level:  Lab Results  Component Value Date   ETH <10 08/03/2022   ETH <10 85/88/5027    Metabolic Disorder Labs:  No results found for: "HGBA1C", "MPG" No results found for: "PROLACTIN" No results found for: "CHOL", "TRIG", "HDL", "CHOLHDL", "VLDL", "LDLCALC"  See Psychiatric Specialty Exam and Suicide Risk Assessment completed by Attending Physician prior to discharge.  Discharge destination:  Home  Is patient on multiple antipsychotic therapies at discharge:  No   Has Patient had three or more failed trials of antipsychotic monotherapy by history:  No  Recommended Plan for Multiple Antipsychotic Therapies: NA  Discharge Instructions     Diet - low sodium heart healthy   Complete by: As directed    Discharge wound care:   Complete by: As directed    Continue dressing changes daily continue antibiotics.  Keep wound clean.  Follow-up with outpatient providers if healing is not improving.   Increase activity slowly   Complete by: As directed       Allergies as of 08/11/2022       Reactions   Bee Venom Anaphylaxis   Penicillins Anaphylaxis   Per  Mom, tolerates cefepime 08/04/2022        Medication List     STOP taking these medications    feeding supplement Liqd   folic acid 1 MG tablet Commonly known as: FOLVITE   multivitamin with minerals Tabs tablet   thiamine 100 MG tablet Commonly known as: Vitamin B-1       TAKE these medications      Indication  ascorbic acid 500 MG tablet Commonly known as: VITAMIN C Take 1 tablet (500 mg total) by mouth 2 (two) times daily.  Indication: Burn   doxycycline 100 MG tablet Commonly known as: VIBRA-TABS Take 1 tablet (100 mg total) by mouth every 12 (twelve) hours.  Indication: Infection of the Skin and/or Skin Structures   famotidine 20 MG tablet Commonly known as: PEPCID Take 1 tablet (20 mg total) by mouth 2 (two) times daily.  Indication: Gastroesophageal Reflux Disease   gabapentin 300 MG capsule Commonly known as: NEURONTIN Take 2 capsules (600 mg total) by mouth 3 (three) times daily as needed (Anxiety).  Indication: Fibromyalgia Syndrome, Generalized Anxiety Disorder   hydrOXYzine 50 MG tablet Commonly known as: ATARAX Take 1 tablet (50 mg total) by mouth 3 (three) times daily as needed for anxiety.  Indication: Feeling Anxious   nicotine 21 mg/24hr patch Commonly known as: NICODERM CQ - dosed in mg/24 hours Place 1 patch (21 mg total) onto the skin daily.  Indication: Nicotine Addiction   nicotine polacrilex 2 MG gum Commonly known as: NICORETTE Take 1 each (2 mg total) by mouth as needed for smoking cessation.  Indication: Nicotine Addiction   QUEtiapine 50 MG tablet Commonly known as: SEROQUEL Take 1 tablet (50 mg total) by mouth at bedtime.  Indication: Sleep and anxiety         Follow-up recommendations:  Other:  Patient is strongly urged to get back into regular substance abuse treatment as soon as possible possibly an [place] again where he was doing well.  He plans to take care of his legal issues so that that will be cleared up.   Expressed multiple times our hope that he will follow-up and try and keep himself sober.  He was given low-dose Seroquel to help with sleep at  night as well as gabapentin for pain and anxiety and appears to have tolerated those fine.  Comments: Prescriptions and supply at discharge  Signed: Alethia Berthold, MD 08/11/2022, 9:34 AM

## 2022-08-11 NOTE — Progress Notes (Signed)
  [facility] Adult Case Management Discharge Plan :  Will you be returning to the same living situation after discharge:  No. Pt reports that he is going to his mothers home in [place]. Reports mother has purchased the ticket. At discharge, do you have transportation home?: Yes,  pt reports a peer will provide transportation.  Do you have the ability to pay for your medications: No.  Release of information consent forms completed and in the chart;  Patient's signature needed at discharge.  Patient to Follow up at:  Follow-up Information     [facility] Follow up.   Why: Walk in hours are Monday, Wednesday and Friday 8AM to 4PM. Contact information: [address] [facility] [address] [phone]                 Next level of care provider has access to [place] and Suicide Prevention discussed: Yes,  SPE completed with the patient.      Has patient been referred to the Quitline?: Patient refused referral  Patient has been referred for addiction treatment: Pt. refused referral  Rozann Lesches, LCSW 08/11/2022, 10:13 AM

## 2022-08-11 NOTE — Progress Notes (Signed)
Patient pleasant and cooperative on approach. Denies SI,HI and AVH. Verbalized understanding discharge instructions and follow up care. 7 days medicines given to patient. All belongings returned to patient. Patient escorted out by staff and transported by cab.

## 2022-08-11 NOTE — Progress Notes (Signed)
Patient active on the unit. Gets along well with his peers. Presents animated and restless upon approach.  Leg was rewrapped due to bandage movement. Appears to be healing on schedule. Med compliant and received meds without issue. Denies si/hi/avh depression.  Endorses anxiety 3/10 and pain 7/10- Leff lower leg. Reports being able to leave tomorrow and appears to be ready. Discussed long term rehab in the future for him once he visits family out of town. Appears to be ready for discharge.     C Butler-Nicholson, LPN

## 2022-08-11 NOTE — BHH Suicide Risk Assessment (Signed)
[facility] Discharge Suicide Risk Assessment   Principal Problem: Adjustment disorder with mixed disturbance of emotions and conduct Discharge Diagnoses: Principal Problem:   Adjustment disorder with mixed disturbance of emotions and conduct Active Problems:   Cocaine use   Cellulitis of left lower extremity   Amphetamine abuse (HCC)   Total Time spent with patient: 30 minutes  Musculoskeletal: Strength & Muscle Tone: within normal limits Gait & Station: normal Patient leans: N/A  Psychiatric Specialty Exam  Presentation  General Appearance:  Disheveled  Eye Contact: Poor  Speech: Clear and Coherent  Speech Volume: Normal  Handedness: Right   Mood and Affect  Mood: Depressed; Hopeless; Worthless  Duration of Depression Symptoms: No data recorded Affect: Depressed; Flat; Tearful   Thought Process  Thought Processes: Coherent  Descriptions of Associations:Intact  Orientation:Full (Time, Place and Person)  Thought Content:Illogical  History of Schizophrenia/Schizoaffective disorder:No data recorded Duration of Psychotic Symptoms:No data recorded Hallucinations:No data recorded Ideas of Reference:None  Suicidal Thoughts:No data recorded Homicidal Thoughts:No data recorded  Sensorium  Memory: Immediate Good  Judgment: Poor  Insight: Poor   Executive Functions  Concentration: Fair  Attention Span: Fair  Recall: [place] of Knowledge: Fair  Language: Good   Psychomotor Activity  Psychomotor Activity:No data recorded  Assets  Assets: Social Support; Resilience   Sleep  Sleep:No data recorded  Physical Exam: Physical Exam Vitals and nursing note reviewed.  Constitutional:      Appearance: Normal appearance.  HENT:     Head: Normocephalic and atraumatic.     Mouth/Throat:     Pharynx: Oropharynx is clear.  Eyes:     Pupils: Pupils are equal, round, and reactive to light.  Cardiovascular:     Rate and Rhythm: Normal  rate and regular rhythm.  Pulmonary:     Effort: Pulmonary effort is normal.     Breath sounds: Normal breath sounds.  Abdominal:     General: Abdomen is flat.     Palpations: Abdomen is soft.  Musculoskeletal:        General: Normal range of motion.  Skin:    General: Skin is warm and dry.  Neurological:     General: No focal deficit present.     Mental Status: He is alert. Mental status is at baseline.  Psychiatric:        Attention and Perception: Attention normal.        Mood and Affect: Mood normal.        Speech: Speech normal.        Behavior: Behavior normal.        Thought Content: Thought content normal.        Cognition and Memory: Cognition normal.        Judgment: Judgment normal.    Review of Systems  Constitutional: Negative.   HENT: Negative.    Eyes: Negative.   Respiratory: Negative.    Cardiovascular: Negative.   Gastrointestinal: Negative.   Musculoskeletal: Negative.   Skin: Negative.   Neurological: Negative.   Psychiatric/Behavioral: Negative.     Blood pressure (!) 140/94, pulse 96, temperature 98.2 F (36.8 C), temperature source Oral, resp. rate 17, height 5\' 10"  (1.778 m), weight 68.9 kg, SpO2 100 %. Body mass index is 21.81 kg/m.  Mental Status Per Nursing Assessment::   On Admission:  NA  Demographic Factors:  Male, Adolescent or young adult, Caucasian, Low socioeconomic status, and Unemployed  Loss Factors: Decline in physical health and Legal issues  Historical Factors: Impulsivity  Risk  Reduction Factors:   Positive coping skills or problem solving skills  Continued Clinical Symptoms:  Severe Anxiety and/or Agitation Alcohol/Substance Abuse/Dependencies  Cognitive Features That Contribute To Risk:  None    Suicide Risk:  Minimal: No identifiable suicidal ideation.  Patients presenting with no risk factors but with morbid ruminations; may be classified as minimal risk based on the severity of the depressive  symptoms    Plan Of Care/Follow-up recommendations:  Other:  Patient denies suicidal ideation.  He has denied suicidal thoughts throughout his time in the hospital and has been cooperative with treatment.  Affect is euthymic.  He expresses positive plans for the future.  Does not appear to be at acute danger to himself.  He has been open to counseling about the importance of continuing to engage in substance abuse treatment.  Alethia Berthold, MD 08/11/2022, 9:32 AM

## 2023-04-26 ENCOUNTER — Emergency Department
Admission: EM | Admit: 2023-04-26 | Discharge: 2023-04-26 | Disposition: A | Discharge status: Home / Self Care | Payer: MEDICAID | Attending: Emergency Medicine | Admitting: Emergency Medicine

## 2023-04-26 ENCOUNTER — Other Ambulatory Visit: Payer: Self-pay

## 2023-04-26 ENCOUNTER — Encounter: Payer: Self-pay | Admitting: Intensive Care

## 2023-04-26 DIAGNOSIS — J45909 Unspecified asthma, uncomplicated: Secondary | ICD-10-CM | POA: Insufficient documentation

## 2023-04-26 DIAGNOSIS — K226 Gastro-esophageal laceration-hemorrhage syndrome: Secondary | ICD-10-CM | POA: Insufficient documentation

## 2023-04-26 DIAGNOSIS — E875 Hyperkalemia: Secondary | ICD-10-CM | POA: Insufficient documentation

## 2023-04-26 DIAGNOSIS — R112 Nausea with vomiting, unspecified: Secondary | ICD-10-CM | POA: Diagnosis present

## 2023-04-26 DIAGNOSIS — D72829 Elevated white blood cell count, unspecified: Secondary | ICD-10-CM | POA: Insufficient documentation

## 2023-04-26 DIAGNOSIS — F1190 Opioid use, unspecified, uncomplicated: Secondary | ICD-10-CM | POA: Insufficient documentation

## 2023-04-26 LAB — URINALYSIS, ROUTINE W REFLEX MICROSCOPIC
Bilirubin Urine: NEGATIVE
Glucose, UA: NEGATIVE mg/dL
Hgb urine dipstick: NEGATIVE
Ketones, ur: 20 mg/dL — AB
Leukocytes,Ua: NEGATIVE
Nitrite: NEGATIVE
Protein, ur: NEGATIVE mg/dL
Specific Gravity, Urine: 1.027 (ref 1.005–1.030)
pH: 5 (ref 5.0–8.0)

## 2023-04-26 LAB — URINE DRUG SCREEN, QUALITATIVE (ARMC ONLY)
Amphetamines, Ur Screen: NOT DETECTED
Barbiturates, Ur Screen: NOT DETECTED
Benzodiazepine, Ur Scrn: NOT DETECTED
Cannabinoid 50 Ng, Ur ~~LOC~~: NOT DETECTED
Cocaine Metabolite,Ur ~~LOC~~: NOT DETECTED
MDMA (Ecstasy)Ur Screen: NOT DETECTED
Methadone Scn, Ur: NOT DETECTED
Opiate, Ur Screen: POSITIVE — AB
Phencyclidine (PCP) Ur S: NOT DETECTED
Tricyclic, Ur Screen: NOT DETECTED

## 2023-04-26 LAB — CBC WITH DIFFERENTIAL/PLATELET
Abs Immature Granulocytes: 0.05 10*3/uL (ref 0.00–0.07)
Basophils Absolute: 0 10*3/uL (ref 0.0–0.1)
Basophils Relative: 0 %
Eosinophils Absolute: 0 10*3/uL (ref 0.0–0.5)
Eosinophils Relative: 0 %
HCT: 42.3 % (ref 39.0–52.0)
Hemoglobin: 14 g/dL (ref 13.0–17.0)
Immature Granulocytes: 0 %
Lymphocytes Relative: 10 %
Lymphs Abs: 1.4 10*3/uL (ref 0.7–4.0)
MCH: 29.7 pg (ref 26.0–34.0)
MCHC: 33.1 g/dL (ref 30.0–36.0)
MCV: 89.6 fL (ref 80.0–100.0)
Monocytes Absolute: 1 10*3/uL (ref 0.1–1.0)
Monocytes Relative: 7 %
Neutro Abs: 11.1 10*3/uL — ABNORMAL HIGH (ref 1.7–7.7)
Neutrophils Relative %: 83 %
Platelets: 236 10*3/uL (ref 150–400)
RBC: 4.72 MIL/uL (ref 4.22–5.81)
RDW: 12.6 % (ref 11.5–15.5)
WBC: 13.6 10*3/uL — ABNORMAL HIGH (ref 4.0–10.5)
nRBC: 0 % (ref 0.0–0.2)

## 2023-04-26 LAB — COMPREHENSIVE METABOLIC PANEL
ALT: 45 U/L — ABNORMAL HIGH (ref 0–44)
AST: 39 U/L (ref 15–41)
Albumin: 4.9 g/dL (ref 3.5–5.0)
Alkaline Phosphatase: 74 U/L (ref 38–126)
Anion gap: 12 (ref 5–15)
BUN: 17 mg/dL (ref 6–20)
CO2: 28 mmol/L (ref 22–32)
Calcium: 8.8 mg/dL — ABNORMAL LOW (ref 8.9–10.3)
Chloride: 97 mmol/L — ABNORMAL LOW (ref 98–111)
Creatinine, Ser: 1.12 mg/dL (ref 0.61–1.24)
GFR, Estimated: >60 mL/min (ref >60)
Glucose, Bld: 95 mg/dL (ref 70–99)
Potassium: 5.6 mmol/L — ABNORMAL HIGH (ref 3.5–5.1)
Sodium: 137 mmol/L (ref 135–145)
Total Bilirubin: 1.3 mg/dL — ABNORMAL HIGH (ref 0.3–1.2)
Total Protein: 7.8 g/dL (ref 6.5–8.1)

## 2023-04-26 LAB — TYPE AND SCREEN
ABO/RH(D): O POS
Antibody Screen: NEGATIVE

## 2023-04-26 LAB — LIPASE, BLOOD: Lipase: 22 U/L (ref 11–51)

## 2023-04-26 MED ORDER — SODIUM CHLORIDE 0.9 % IV BOLUS
1000.0000 mL | Freq: Once | INTRAVENOUS | Status: AC
  Administered 2023-04-26: 1000 mL via INTRAVENOUS

## 2023-04-26 MED ORDER — PSEUDOEPHEDRINE HCL 30 MG PO TABS
60.0000 mg | ORAL_TABLET | Freq: Once | ORAL | Status: AC
  Administered 2023-04-26: 60 mg via ORAL
  Filled 2023-04-26: qty 2

## 2023-04-26 MED ORDER — ONDANSETRON HCL 4 MG/2ML IJ SOLN
4.0000 mg | Freq: Once | INTRAMUSCULAR | Status: AC
  Administered 2023-04-26: 4 mg via INTRAVENOUS
  Filled 2023-04-26: qty 2

## 2023-04-26 MED ORDER — ACETAMINOPHEN 325 MG PO TABS
650.0000 mg | ORAL_TABLET | Freq: Once | ORAL | Status: AC
  Administered 2023-04-26: 650 mg via ORAL
  Filled 2023-04-26: qty 2

## 2023-04-26 NOTE — ED Notes (Addendum)
Patient SPO2 states in mid-high 90s when awake but when patient drifts off to sleep drops into high 80s. RR rate unlabored, equal and symmetric. Notified MD. Placed patient on 2L [place] while sleeping. Patient aware.

## 2023-04-26 NOTE — Discharge Instructions (Signed)
We have provided a list of outpatient substance abuse resources.  Return to the ER immediately for new, worsening, or persistent severe vomiting, abdominal pain, recurrent blood in the vomit, weakness or lightheadedness, or any other new or worsening symptoms that concern you.

## 2023-04-26 NOTE — ED Notes (Signed)
Patient given sandwich tray at this time.

## 2023-04-26 NOTE — ED Triage Notes (Signed)
Patient presents with blood in emesis. Patient reports he relapsed today with heroin. Patient falls asleep easily.   Drug abuse history with heroin, crack, cocaine  Denies alcohol use today

## 2023-04-26 NOTE — ED Provider Notes (Signed)
[facility] Provider Note    Event Date/Time   First MD Initiated Contact with Patient 04/26/23 1504     (approximate)   History   Hematemesis   HPI  Brian Decker. is a 27 y.o. male with a history of polysubstance abuse including heroin, cocaine, and methamphetamine, as well as asthma who presents with nausea and vomiting, pressure in his years, and malaise after using heroin earlier today.  The patient states that he previously had been using multiple drugs but had been sober for about 10 months prior to today.  He took the heroin by snorting.  He states that after several episodes of vomiting, he started to have a small quantity of blood in the emesis.  The nausea and vomiting have now improved.  She denies any associated abdominal pain or blood in the stool.  He reports pressure to both the ears but no ear pain or change in his hearing.  He denies any other drug or alcohol use today.  The past medical records.  The patient was admitted to inpatient psychiatry in January; per the note from Dr. Toni Amend he was treated for adjustment disorder with mixed disturbance of emotions and conduct.   Physical Exam   Triage Vital Signs: ED Triage Vitals  Encounter Vitals Group     BP 04/26/23 1454 120/71     Systolic BP Percentile --      Diastolic BP Percentile --      Pulse Rate 04/26/23 1454 94     Resp 04/26/23 1454 16     Temp 04/26/23 1454 98.3 F (36.8 C)     Temp Source 04/26/23 1454 Oral     SpO2 04/26/23 1454 97 %     Weight 04/26/23 1457 173 lb (78.5 kg)     Height 04/26/23 1457 6' (1.829 m)     Head Circumference --      Peak Flow --      Pain Score 04/26/23 1457 0     Pain Loc --      Pain Education --      Exclude from Growth Chart --     Most recent vital signs: Vitals:   04/26/23 2035 04/26/23 2038  BP: (!) 127/59 110/64  Pulse: 74 82  Resp: 12 13  Temp: 98.3 F (36.8 C)   SpO2: 99% 100%     General: Awake, no distress.   CV:  Good peripheral perfusion.  Resp:  Normal effort.  Abd:  Soft and nontender.  No distention. Other:  Oropharynx clear.  Dry mucous membranes.  Bilateral ear canals and TMs appear normal.   ED Results / Procedures / Treatments   Labs (all labs ordered are listed, but only abnormal results are displayed) Labs Reviewed  COMPREHENSIVE METABOLIC PANEL - Abnormal; Notable for the following components:      Result Value   Potassium 5.6 (*)    Chloride 97 (*)    Calcium 8.8 (*)    ALT 45 (*)    Total Bilirubin 1.3 (*)    All other components within normal limits  CBC WITH DIFFERENTIAL/PLATELET - Abnormal; Notable for the following components:   WBC 13.6 (*)    Neutro Abs 11.1 (*)    All other components within normal limits  URINE DRUG SCREEN, QUALITATIVE ([facility] ONLY) - Abnormal; Notable for the following components:   Opiate, Ur Screen POSITIVE (*)    All other components within normal limits  URINALYSIS, ROUTINE W REFLEX  MICROSCOPIC - Abnormal; Notable for the following components:   Color, Urine YELLOW (*)    APPearance CLEAR (*)    Ketones, ur 20 (*)    All other components within normal limits  LIPASE, BLOOD  TYPE AND SCREEN     EKG   RADIOLOGY   PROCEDURES:  Critical Care performed: No  Procedures   MEDICATIONS ORDERED IN ED: Medications  pseudoephedrine (SUDAFED) tablet 60 mg (60 mg Oral Given 04/26/23 1617)  sodium chloride 0.9 % bolus 1,000 mL (0 mLs Intravenous Stopped 04/26/23 1742)  ondansetron (ZOFRAN) injection 4 mg (4 mg Intravenous Given 04/26/23 1615)  acetaminophen (TYLENOL) tablet 650 mg (650 mg Oral Given 04/26/23 1747)  sodium chloride 0.9 % bolus 1,000 mL (0 mLs Intravenous Stopped 04/26/23 2004)     IMPRESSION / MDM / ASSESSMENT AND PLAN / ED COURSE  I reviewed the triage vital signs and the nursing notes.  27 year old male with PMH as noted above presents with nausea and vomiting eventually with blood-tinged vomitus as well as pressure to  his ears after relapsing on heroin today.  On exam the patient is relatively well-appearing, alert and oriented, with normal vital signs.  O2 saturation does dip into the high 80s when he falls asleep but otherwise he is maintaining good respiratory effort and her oxygenation.  Differential diagnosis includes, but is not limited to, gastritis, GERD, gastroenteritis, Mallory-Weiss tear.  The patient has a history of hepatitis but no known cirrhosis, esophageal varices, or prior history of GI bleed.  We will obtain lab workup, observe the patient, and reassess.  Patient's presentation is most consistent with acute presentation with potential threat to life or bodily function.  The patient is on the cardiac monitor to evaluate for evidence of arrhythmia and/or significant heart rate changes.   ----------------------------------------- 8:35 PM on 04/26/2023 -----------------------------------------  Initially drinking some juice, the patient had one more episode of vomiting here although there was no blood.  Since that time, the patient states that his nausea has almost completely resolved and he has had not had further vomiting.  He has been able to tolerate p.o. solids and liquids.  Lab workup is unremarkable.  UDS is positive for opiates consistent with the patient's given history.  CBC shows mild leukocytosis.  CMP shows potassium of 5.6 although this result is hemolyzed so likely not accurate.  The patient has not had no peaked T waves on the monitor or other findings concerning for hyperkalemia.  Other labs are unremarkable.  At this time, the patient has received 2 L of fluids.  He is alert and oriented, clinically sober, and has no further vomiting or other GI symptoms.  His vital signs are stable.   I did initially consider whether the patient may benefit from inpatient admission given the blood in the vomitus earlier, tachycardia, and dehydration.  However he now feels well and would like to  go home.  Given the negative workup and resolution of his symptoms, he is stable for discharge at this time.  I have given him information for outpatient substance abuse resources.  I gave strict return precautions and he expressed understanding.  FINAL CLINICAL IMPRESSION(S) / ED DIAGNOSES   Final diagnoses:  Nausea and vomiting, unspecified vomiting type  Mallory-Weiss tear  Heroin use     Rx / DC Orders   ED Discharge Orders     None        Note:  This document was prepared using Dragon voice recognition software and  may include unintentional dictation errors.    Dionne Bucy, MD 04/26/23 2045

## 2023-12-01 ENCOUNTER — Other Ambulatory Visit: Payer: Self-pay

## 2024-01-21 ENCOUNTER — Ambulatory Visit: Payer: Self-pay | Admitting: Gerontology

## 2024-01-21 ENCOUNTER — Encounter: Payer: Self-pay | Admitting: Gerontology

## 2024-01-21 VITALS — BP 146/85 | HR 94 | Ht 70.5 in | Wt 179.5 lb

## 2024-01-21 DIAGNOSIS — K047 Periapical abscess without sinus: Secondary | ICD-10-CM | POA: Insufficient documentation

## 2024-01-21 DIAGNOSIS — Z7689 Persons encountering health services in other specified circumstances: Secondary | ICD-10-CM | POA: Insufficient documentation

## 2024-01-21 MED ORDER — CLINDAMYCIN HCL 300 MG PO CAPS: 300.0000 mg | ORAL_CAPSULE | Freq: Three times a day (TID) | ORAL | 0 refills | Status: DC

## 2024-01-21 NOTE — Progress Notes (Signed)
 New Patient Office Visit  Subjective    Patient ID: Brian Hewes., male    DOB: 1995-10-26  Age: 28 y.o. MRN: [id number]  CC: No chief complaint on file.   HPI Brian Decker. Is a 28 y/o male history of polysubstance abuse including heroin, cocaine, and methamphetamine, as well as asthma presents to establish care. He c/o infected cracked tooth to right lower mandible, that started yesterday. States that he's feeling feverish, site pulsates, swollen and discomfort when touched. He states that he alternates taking 400 mg Ibuprofen  and Tylenol  500 mg  with minimal relief. Overall, he states that he's doing well and offers no further complaint.   Outpatient Encounter Medications as of 01/21/2024  Medication Sig   clindamycin  (CLEOCIN ) 300 MG capsule Take 1 capsule (300 mg total) by mouth 3 (three) times daily.   [DISCONTINUED] ascorbic acid  (VITAMIN C ) 500 MG tablet Take 1 tablet (500 mg total) by mouth 2 (two) times daily.   [DISCONTINUED] doxycycline  (VIBRA -TABS) 100 MG tablet Take 1 tablet (100 mg total) by mouth every 12 (twelve) hours.   [DISCONTINUED] famotidine  (PEPCID ) 20 MG tablet Take 1 tablet (20 mg total) by mouth 2 (two) times daily.   [DISCONTINUED] gabapentin  (NEURONTIN ) 300 MG capsule Take 2 capsules (600 mg total) by mouth 3 (three) times daily as needed (Anxiety).   [DISCONTINUED] hydrOXYzine  (ATARAX ) 50 MG tablet Take 1 tablet (50 mg total) by mouth 3 (three) times daily as needed for anxiety.   [DISCONTINUED] nicotine  (NICODERM CQ  - DOSED IN MG/24 HOURS) 21 mg/24hr patch Place 1 patch (21 mg total) onto the skin daily.   [DISCONTINUED] nicotine  polacrilex (NICORETTE ) 2 MG gum Take 1 each (2 mg total) by mouth as needed for smoking cessation.   [DISCONTINUED] QUEtiapine  (SEROQUEL ) 50 MG tablet Take 1 tablet (50 mg total) by mouth at bedtime.   No facility-administered encounter medications on file as of 01/21/2024.    Past Medical History:  Diagnosis Date   Asthma     Cocaine abuse (HCC)    Crack cocaine use    Hepatitis    Heroin abuse (HCC) 04/07/2020   Methamphetamine use (HCC) 04/07/2020    No past surgical history on file.  No family history on file.  Social History   Socioeconomic History   Marital status: Single    Spouse name: Not on file   Number of children: Not on file   Years of education: Not on file   Highest education level: Not on file  Occupational History   Not on file  Tobacco Use   Smoking status: Former    Current packs/day: 0.50    Types: Cigarettes   Smokeless tobacco: Current   Tobacco comments:    pouches  Vaping Use   Vaping status: Every Day  Substance and Sexual Activity   Alcohol use: Yes    Comment: occ   Drug use: Yes    Types: Marijuana, IV, Cocaine, Methamphetamines    Comment: heroin   Sexual activity: Yes    Birth control/protection: None  Other Topics Concern   Not on file  Social History Narrative   Not on file   Social Drivers of Health   Financial Resource Strain: Not on file  Food Insecurity: Patient Declined (08/06/2022)   Hunger Vital Sign    Worried About Running Out of Food in the Last Year: Patient declined    Ran Out of Food in the Last Year: Patient declined  Transportation Needs: Patient Declined (08/06/2022)  PRAPARE - Administrator, Civil Service (Medical): Patient declined    Lack of Transportation (Non-Medical): Patient declined  Physical Activity: Not on file  Stress: Not on file  Social Connections: Not on file  Intimate Partner Violence: Patient Declined (08/06/2022)   Humiliation, Afraid, Rape, and Kick questionnaire    Fear of Current or Ex-Partner: Patient declined    Emotionally Abused: Patient declined    Physically Abused: Patient declined    Sexually Abused: Patient declined    Review of Systems  Constitutional: Negative.   HENT: Negative.         Tooth pain  Eyes: Negative.   Respiratory: Negative.    Cardiovascular: Negative.    Gastrointestinal: Negative.   Genitourinary: Negative.   Musculoskeletal: Negative.   Neurological: Negative.   Endo/Heme/Allergies: Negative.   Psychiatric/Behavioral: Negative.          Objective    BP (!) 146/85 (BP Location: Right Arm, Patient Position: Sitting, Cuff Size: Large)   Pulse 94   Ht 5' 10.5 (1.791 m)   Wt 179 lb 8 oz (81.4 kg)   BMI 25.39 kg/m   Physical Exam HENT:     Head: Normocephalic and atraumatic.     Nose: Nose normal.     Mouth/Throat:     Lips: Pink.     Mouth: Mucous membranes are moist.     Dentition: Abnormal dentition. Dental tenderness, dental caries and dental abscesses present.     Tongue: No lesions.     Pharynx: Oropharynx is clear.     Tonsils: No tonsillar exudate or tonsillar abscesses.  Eyes:     Extraocular Movements: Extraocular movements intact.     Conjunctiva/sclera: Conjunctivae normal.     Pupils: Pupils are equal, round, and reactive to light.  Cardiovascular:     Rate and Rhythm: Normal rate and regular rhythm.     Pulses: Normal pulses.     Heart sounds: Normal heart sounds.  Pulmonary:     Effort: Pulmonary effort is normal.     Breath sounds: Normal breath sounds.  Abdominal:     General: Bowel sounds are normal.     Palpations: Abdomen is soft.  Genitourinary:    Comments: Deferred per patient Musculoskeletal:        General: Normal range of motion.     Cervical back: Normal range of motion.  Skin:    General: Skin is warm.     Capillary Refill: Capillary refill takes less than 2 seconds.  Neurological:     General: No focal deficit present.     Mental Status: He is alert and oriented to person, place, and time. Mental status is at baseline.  Psychiatric:        Mood and Affect: Mood normal.        Behavior: Behavior normal.        Thought Content: Thought content normal.        Judgment: Judgment normal.         Assessment & Plan:   1. Encounter to establish care (Primary) - Routine labs  will be checked. - CBC w/Diff; Future - Lipid panel; Future - Comp Met (CMET); Future - Urinalysis; Future - Urine drugs of abuse scrn w alc, routine ([facility] [place] [facility]); Future - HgB A1c; Future  2. Tooth abscess - Decayed, cracked tooth to right lower mandible, erythema and swelling to gum and tenderness when right cheek is touched. He was started on Clindamycin , educated on  medication side effects and advised to notify clinic and go to the ED. He verbalized understanding. He was provided with Dental application and referral. - clindamycin  (CLEOCIN ) 300 MG capsule; Take 1 capsule (300 mg total) by mouth 3 (three) times daily.  Dispense: 15 capsule; Refill: 0   Return in about 19 days (around 02/09/2024), or if symptoms worsen or fail to improve, for Lab 01/26/24, F/U 7/22 in clinic, provide Dental application.   Lloyd Cullinan E Tyniesha Howald, NP

## 2024-01-25 ENCOUNTER — Other Ambulatory Visit: Payer: Self-pay

## 2024-01-26 ENCOUNTER — Other Ambulatory Visit: Payer: Self-pay

## 2024-01-26 DIAGNOSIS — Z7689 Persons encountering health services in other specified circumstances: Secondary | ICD-10-CM

## 2024-01-26 NOTE — Progress Notes (Unsigned)
 hep

## 2024-01-27 LAB — SPECIMEN STATUS REPORT

## 2024-01-28 LAB — URINE DRUGS OF ABUSE SCREEN W ALC, ROUTINE (REF LAB)
Amphetamines, Urine: NEGATIVE ng/mL
Barbiturate Quant, Ur: NEGATIVE ng/mL
Benzodiazepine Quant, Ur: NEGATIVE ng/mL
Cannabinoid Quant, Ur: NEGATIVE ng/mL
Cocaine (Metab.): NEGATIVE ng/mL
Creatinine, Urine: 167.9 mg/dL (ref 20.0–300.0)
Ethanol, Urine: NEGATIVE %
Methadone Screen, Urine: NEGATIVE ng/mL
Nitrite Urine, Quantitative: NEGATIVE ug/mL
OPIATE SCREEN URINE: NEGATIVE ng/mL
PCP Quant, Ur: NEGATIVE ng/mL
Propoxyphene: NEGATIVE ng/mL
pH, Urine: 6.1 (ref 4.5–8.9)

## 2024-01-28 LAB — COMPREHENSIVE METABOLIC PANEL WITH GFR

## 2024-01-28 LAB — LIPID PANEL

## 2024-02-03 LAB — HEPATITIS C ANTIBODY: Hep C Virus Ab: REACTIVE — AB

## 2024-02-03 LAB — HEPATITIS B SURFACE ANTIBODY,QUALITATIVE: Hep B Surface Ab, Qual: NONREACTIVE

## 2024-02-03 LAB — HEPATITIS A ANTIBODY, TOTAL: hep A Total Ab: POSITIVE — AB

## 2024-02-03 LAB — HEPATITIS B CORE ANTIBODY, IGM: Hep B C IgM: NEGATIVE

## 2024-02-09 ENCOUNTER — Ambulatory Visit: Payer: Self-pay | Admitting: Gerontology

## 2024-02-09 LAB — URINALYSIS
Bilirubin, UA: NEGATIVE
Glucose, UA: NEGATIVE
Ketones, UA: NEGATIVE
Leukocytes,UA: NEGATIVE
Nitrite, UA: NEGATIVE
Protein,UA: NEGATIVE
RBC, UA: NEGATIVE
Specific Gravity, UA: 1.019 (ref 1.005–1.030)
Urobilinogen, Ur: 1 mg/dL (ref 0.2–1.0)
pH, UA: 6.5 (ref 5.0–7.5)

## 2024-02-09 LAB — COMPREHENSIVE METABOLIC PANEL WITH GFR

## 2024-02-09 LAB — CBC WITH DIFFERENTIAL/PLATELET
Basophils Absolute: 0.1 x10E3/uL (ref 0.0–0.2)
Basos: 1 %
EOS (ABSOLUTE): 0.6 x10E3/uL — ABNORMAL HIGH (ref 0.0–0.4)
Eos: 10 %
Hematocrit: 37.6 % (ref 37.5–51.0)
Hemoglobin: 12.5 g/dL — ABNORMAL LOW (ref 13.0–17.7)
Immature Grans (Abs): 0 x10E3/uL (ref 0.0–0.1)
Immature Granulocytes: 0 %
Lymphocytes Absolute: 2 x10E3/uL (ref 0.7–3.1)
Lymphs: 36 %
MCH: 29.6 pg (ref 26.6–33.0)
MCHC: 33.2 g/dL (ref 31.5–35.7)
MCV: 89 fL (ref 79–97)
Monocytes Absolute: 0.5 x10E3/uL (ref 0.1–0.9)
Monocytes: 9 %
Neutrophils Absolute: 2.5 x10E3/uL (ref 1.4–7.0)
Neutrophils: 44 %
Platelets: 262 x10E3/uL (ref 150–450)
RBC: 4.23 x10E6/uL (ref 4.14–5.80)
RDW: 12.4 % (ref 11.6–15.4)
WBC: 5.5 x10E3/uL (ref 3.4–10.8)

## 2024-02-09 LAB — LIPID PANEL

## 2024-02-09 LAB — HEMOGLOBIN A1C
Est. average glucose Bld gHb Est-mCnc: 100 mg/dL
Hgb A1c MFr Bld: 5.1 % (ref 4.8–5.6)

## 2024-02-16 ENCOUNTER — Other Ambulatory Visit: Payer: Self-pay

## 2024-02-16 ENCOUNTER — Ambulatory Visit: Payer: Self-pay | Admitting: Gerontology

## 2024-02-16 ENCOUNTER — Encounter: Payer: Self-pay | Admitting: Gerontology

## 2024-02-16 VITALS — BP 116/81 | HR 71 | Wt 178.2 lb

## 2024-02-16 DIAGNOSIS — Z0000 Encounter for general adult medical examination without abnormal findings: Secondary | ICD-10-CM

## 2024-02-16 DIAGNOSIS — T148XXA Other injury of unspecified body region, initial encounter: Secondary | ICD-10-CM | POA: Insufficient documentation

## 2024-02-16 DIAGNOSIS — R768 Other specified abnormal immunological findings in serum: Secondary | ICD-10-CM | POA: Insufficient documentation

## 2024-02-16 MED ORDER — TERBINAFINE HCL 1 % EX CREA
1.0000 | TOPICAL_CREAM | Freq: Two times a day (BID) | CUTANEOUS | 0 refills | Status: AC
  Filled 2024-02-16: qty 30, 15d supply, fill #0

## 2024-02-16 MED ORDER — HYDROCORTISONE 1 % EX CREA
1.0000 | TOPICAL_CREAM | Freq: Two times a day (BID) | CUTANEOUS | 0 refills | Status: AC
  Filled 2024-02-16: qty 28, 9d supply, fill #0
  Filled 2024-02-16: qty 30, 10d supply, fill #0

## 2024-02-16 NOTE — Progress Notes (Signed)
 Established Patient Office Visit  Subjective   Patient ID: Brian Mazer., male    DOB: 1996-06-05  Age: 28 y.o. MRN: [id number]  No chief complaint on file.   HPI Brian Haugan Jr. Is a 28 y/o male history of polysubstance abuse including heroin, cocaine, and methamphetamine, as well as asthma presents  for lab review. He c/o having Athelets foot, pruritic excoriated area  to  the sole of his right foot that started for 4 months while he was incarcerated. He denies any erythema, swelling and discharge. His ALT was 50, Hepatitis A Total Ab positive and Hepatitis C virus Ab positive. He denies right upper abdominal quadrant pain, jaundice and dark urine. Overall, he states that he's doing well and offers no further complaint.  Review of Systems  Constitutional: Negative.   Eyes: Negative.   Respiratory: Negative.    Cardiovascular: Negative.   Gastrointestinal: Negative.   Skin: Negative.   Neurological: Negative.   Endo/Heme/Allergies: Negative.   Psychiatric/Behavioral: Negative.        Objective:     BP 116/81 (BP Location: Left Arm, Patient Position: Sitting)   Pulse 71   Wt 178 lb 3.2 oz (80.8 kg)   BMI 25.21 kg/m  BP Readings from Last 3 Encounters:  02/16/24 116/81  01/21/24 (!) 146/85  04/26/23 110/64   Wt Readings from Last 3 Encounters:  02/16/24 178 lb 3.2 oz (80.8 kg)  01/21/24 179 lb 8 oz (81.4 kg)  04/26/23 173 lb (78.5 kg)      Physical Exam HENT:     Head: Normocephalic and atraumatic.     Mouth/Throat:     Mouth: Mucous membranes are moist.  Eyes:     Extraocular Movements: Extraocular movements intact.     Conjunctiva/sclera: Conjunctivae normal.     Pupils: Pupils are equal, round, and reactive to light.  Cardiovascular:     Rate and Rhythm: Normal rate and regular rhythm.     Pulses: Normal pulses.     Heart sounds: Normal heart sounds. Murmur: excoriated area to sole of right foot.  Pulmonary:     Effort: Pulmonary effort is normal.      Breath sounds: Normal breath sounds.  Abdominal:     General: Bowel sounds are normal.     Palpations: Abdomen is soft.  Skin:    General: Skin is warm.     Capillary Refill: Capillary refill takes less than 2 seconds.     Findings: Lesion present.  Neurological:     General: No focal deficit present.     Mental Status: He is alert and oriented to person, place, and time.  Psychiatric:        Mood and Affect: Mood normal.        Behavior: Behavior normal.      Results for orders placed or performed in visit on 02/16/24  Lipid panel  Result Value Ref Range   Cholesterol, Total 139 100 - 199 mg/dL   Triglycerides 892 0 - 149 mg/dL   HDL 36 (L) >60 mg/dL   VLDL Cholesterol Cal 20 5 - 40 mg/dL   LDL Chol Calc (NIH) 83 0 - 99 mg/dL   Chol/HDL Ratio 3.9 0.0 - 5.0 ratio  Comp Met (CMET)  Result Value Ref Range   Glucose 111 (H) 70 - 99 mg/dL   BUN 13 6 - 20 mg/dL   Creatinine, Ser 9.00 0.76 - 1.27 mg/dL   eGFR 891 >40 fO/fpw/8.26   BUN/Creatinine Ratio 13  9 - 20   Sodium 143 134 - 144 mmol/L   Potassium 4.2 3.5 - 5.2 mmol/L   Chloride 105 96 - 106 mmol/L   CO2 25 20 - 29 mmol/L   Calcium  9.3 8.7 - 10.2 mg/dL   Total Protein 6.7 6.0 - 8.5 g/dL   Albumin 4.4 4.3 - 5.2 g/dL   Globulin, Total 2.3 1.5 - 4.5 g/dL   Bilirubin Total 0.6 0.0 - 1.2 mg/dL   Alkaline Phosphatase 80 44 - 121 IU/L   AST 38 0 - 40 IU/L   ALT 50 (H) 0 - 44 IU/L    Last CBC Lab Results  Component Value Date   WBC 5.5 01/26/2024   HGB 12.5 (L) 01/26/2024   HCT 37.6 01/26/2024   MCV 89 01/26/2024   MCH 29.6 01/26/2024   RDW 12.4 01/26/2024   PLT 262 01/26/2024   Last metabolic panel Lab Results  Component Value Date   GLUCOSE 111 (H) 02/16/2024   NA 143 02/16/2024   K 4.2 02/16/2024   CL 105 02/16/2024   CO2 25 02/16/2024   BUN 13 02/16/2024   CREATININE 0.99 02/16/2024   GFRNONAA >60 04/26/2023   CALCIUM  9.3 02/16/2024   PHOS 3.2 08/06/2022   PROT 6.7 02/16/2024   ALBUMIN 4.4  02/16/2024   LABGLOB 2.3 02/16/2024   AGRATIO CANCELED 01/26/2024   BILITOT 0.6 02/16/2024   ALKPHOS 80 02/16/2024   AST 38 02/16/2024   ALT 50 (H) 02/16/2024   ANIONGAP 12 04/26/2023   Last lipids Lab Results  Component Value Date   CHOL 139 02/16/2024   HDL 36 (L) 02/16/2024   LDLCALC 83 02/16/2024   TRIG 107 02/16/2024   CHOLHDL 3.9 02/16/2024   Last hemoglobin A1c Lab Results  Component Value Date   HGBA1C 5.1 01/26/2024   Last thyroid functions Lab Results  Component Value Date   TSH 0.182 (L) 01/04/2021   Last vitamin D No results found for: 25OHVITD2, 25OHVITD3, VD25OH Last vitamin B12 and Folate No results found for: VITAMINB12, FOLATE    The ASCVD Risk score (Arnett DK, et al., 2019) failed to calculate for the following reasons:   The 2019 ASCVD risk score is only valid for ages 104 to 26    Assessment & Plan:   1. Positive hepatitis C antibody test  and Hepatitis A (Primary) - He was referred to Infectious disease for evaluation and treatment. - Ambulatory referral to Infectious Disease  2. Skin excoriation - Unknown etiology of skin lesion, was started on Hydrocortisone  and Terbinafine , educated on medication side effects and to notify clinic. - hydrocortisone  cream 1 %; Apply 1 Application topically 2 (two) times daily.  Dispense: 30 g; Refill: 0 - terbinafine  (LAMISIL ) 1 % cream; Apply 1 Application topically 2 (two) times daily.  Dispense: 30 g; Refill: 0  3. Health care maintenance - Will recheck cancelled labs. - Comp Met (CMET); Future - Lipid panel; Future   Return in about 5 weeks (around 03/22/2024), or if symptoms worsen or fail to improve.    Brian Decker Brian Sam, NP

## 2024-02-17 LAB — COMPREHENSIVE METABOLIC PANEL WITH GFR
ALT: 50 IU/L — ABNORMAL HIGH (ref 0–44)
AST: 38 IU/L (ref 0–40)
Albumin: 4.4 g/dL (ref 4.3–5.2)
Alkaline Phosphatase: 80 IU/L (ref 44–121)
BUN/Creatinine Ratio: 13 (ref 9–20)
BUN: 13 mg/dL (ref 6–20)
Bilirubin Total: 0.6 mg/dL (ref 0.0–1.2)
CO2: 25 mmol/L (ref 20–29)
Calcium: 9.3 mg/dL (ref 8.7–10.2)
Chloride: 105 mmol/L (ref 96–106)
Creatinine, Ser: 0.99 mg/dL (ref 0.76–1.27)
Globulin, Total: 2.3 g/dL (ref 1.5–4.5)
Glucose: 111 mg/dL — ABNORMAL HIGH (ref 70–99)
Potassium: 4.2 mmol/L (ref 3.5–5.2)
Sodium: 143 mmol/L (ref 134–144)
Total Protein: 6.7 g/dL (ref 6.0–8.5)
eGFR: 108 mL/min/1.73 (ref >59)

## 2024-02-17 LAB — LIPID PANEL
Chol/HDL Ratio: 3.9 ratio (ref 0.0–5.0)
Cholesterol, Total: 139 mg/dL (ref 100–199)
HDL: 36 mg/dL — ABNORMAL LOW (ref >39)
LDL Chol Calc (NIH): 83 mg/dL (ref 0–99)
Triglycerides: 107 mg/dL (ref 0–149)
VLDL Cholesterol Cal: 20 mg/dL (ref 5–40)

## 2024-02-22 ENCOUNTER — Other Ambulatory Visit: Payer: Self-pay

## 2024-02-26 ENCOUNTER — Other Ambulatory Visit: Payer: Self-pay

## 2024-03-10 ENCOUNTER — Encounter: Payer: Self-pay | Admitting: Infectious Diseases

## 2024-03-22 ENCOUNTER — Ambulatory Visit: Payer: Self-pay | Admitting: Gerontology

## 2024-04-07 ENCOUNTER — Other Ambulatory Visit (HOSPITAL_BASED_OUTPATIENT_CLINIC_OR_DEPARTMENT_OTHER): Payer: Self-pay

## 2024-07-26 ENCOUNTER — Telehealth: Payer: Self-pay

## 2024-07-26 NOTE — Telephone Encounter (Signed)
 Called number in computer. Wrong number. Pt is also not at [facility] anymore. No new number or address to put in.
